# Patient Record
Sex: Male | Born: 1955 | Race: White | Hispanic: No | Smoking: Current every day smoker
Health system: Southern US, Community
[De-identification: ages and names within clinical notes are randomized; demographics above are authoritative.]

## PROBLEM LIST (undated history)

## (undated) DIAGNOSIS — K922 Gastrointestinal hemorrhage, unspecified: Secondary | ICD-10-CM

## (undated) DIAGNOSIS — Z720 Tobacco use: Secondary | ICD-10-CM

## (undated) DIAGNOSIS — I10 Essential (primary) hypertension: Secondary | ICD-10-CM

## (undated) DIAGNOSIS — I2510 Atherosclerotic heart disease of native coronary artery without angina pectoris: Secondary | ICD-10-CM

## (undated) DIAGNOSIS — I213 ST elevation (STEMI) myocardial infarction of unspecified site: Secondary | ICD-10-CM

## (undated) DIAGNOSIS — K269 Duodenal ulcer, unspecified as acute or chronic, without hemorrhage or perforation: Secondary | ICD-10-CM

## (undated) DIAGNOSIS — I469 Cardiac arrest, cause unspecified: Secondary | ICD-10-CM

## (undated) DIAGNOSIS — C3490 Malignant neoplasm of unspecified part of unspecified bronchus or lung: Secondary | ICD-10-CM

## (undated) DIAGNOSIS — F1010 Alcohol abuse, uncomplicated: Secondary | ICD-10-CM

## (undated) DIAGNOSIS — J449 Chronic obstructive pulmonary disease, unspecified: Secondary | ICD-10-CM

## (undated) DIAGNOSIS — B1920 Unspecified viral hepatitis C without hepatic coma: Secondary | ICD-10-CM

## (undated) HISTORY — PX: LUNG LOBECTOMY: SHX167

## (undated) HISTORY — PX: TONSILLECTOMY: SUR1361

---

## 2014-01-09 DIAGNOSIS — I213 ST elevation (STEMI) myocardial infarction of unspecified site: Secondary | ICD-10-CM

## 2014-01-09 DIAGNOSIS — I469 Cardiac arrest, cause unspecified: Secondary | ICD-10-CM

## 2014-01-09 HISTORY — DX: ST elevation (STEMI) myocardial infarction of unspecified site: I21.3

## 2014-01-09 HISTORY — DX: Cardiac arrest, cause unspecified: I46.9

## 2014-01-31 ENCOUNTER — Inpatient Hospital Stay
Admission: AD | Admit: 2014-01-31 | Discharge: 2014-03-11 | Disposition: E | Payer: Self-pay | Source: Ambulatory Visit | Attending: Internal Medicine | Admitting: Internal Medicine

## 2014-01-31 ENCOUNTER — Other Ambulatory Visit (HOSPITAL_COMMUNITY): Payer: Self-pay

## 2014-01-31 ENCOUNTER — Encounter: Payer: Self-pay | Admitting: Pulmonary Disease

## 2014-01-31 DIAGNOSIS — J9621 Acute and chronic respiratory failure with hypoxia: Secondary | ICD-10-CM

## 2014-01-31 DIAGNOSIS — R0902 Hypoxemia: Secondary | ICD-10-CM

## 2014-01-31 DIAGNOSIS — J9620 Acute and chronic respiratory failure, unspecified whether with hypoxia or hypercapnia: Secondary | ICD-10-CM

## 2014-01-31 DIAGNOSIS — Z930 Tracheostomy status: Secondary | ICD-10-CM

## 2014-01-31 HISTORY — DX: Cardiac arrest, cause unspecified: I46.9

## 2014-01-31 HISTORY — DX: ST elevation (STEMI) myocardial infarction of unspecified site: I21.3

## 2014-01-31 HISTORY — DX: Alcohol abuse, uncomplicated: F10.10

## 2014-01-31 HISTORY — DX: Gastrointestinal hemorrhage, unspecified: K92.2

## 2014-01-31 HISTORY — DX: Tobacco use: Z72.0

## 2014-01-31 HISTORY — DX: Duodenal ulcer, unspecified as acute or chronic, without hemorrhage or perforation: K26.9

## 2014-01-31 HISTORY — DX: Atherosclerotic heart disease of native coronary artery without angina pectoris: I25.10

## 2014-01-31 HISTORY — DX: Essential (primary) hypertension: I10

## 2014-01-31 HISTORY — DX: Unspecified viral hepatitis C without hepatic coma: B19.20

## 2014-01-31 HISTORY — DX: Chronic obstructive pulmonary disease, unspecified: J44.9

## 2014-01-31 HISTORY — DX: Malignant neoplasm of unspecified part of unspecified bronchus or lung: C34.90

## 2014-01-31 LAB — CBC WITH DIFFERENTIAL/PLATELET
BASOS ABS: 0 10*3/uL (ref 0.0–0.1)
BASOS PCT: 0 % (ref 0–1)
Eosinophils Absolute: 0.2 10*3/uL (ref 0.0–0.7)
Eosinophils Relative: 2 % (ref 0–5)
HCT: 28.6 % — ABNORMAL LOW (ref 39.0–52.0)
Hemoglobin: 8.7 g/dL — ABNORMAL LOW (ref 13.0–17.0)
Lymphocytes Relative: 6 % — ABNORMAL LOW (ref 12–46)
Lymphs Abs: 0.5 10*3/uL — ABNORMAL LOW (ref 0.7–4.0)
MCH: 26.6 pg (ref 26.0–34.0)
MCHC: 30.4 g/dL (ref 30.0–36.0)
MCV: 87.5 fL (ref 78.0–100.0)
Monocytes Absolute: 0.7 10*3/uL (ref 0.1–1.0)
Monocytes Relative: 7 % (ref 3–12)
NEUTROS ABS: 8.1 10*3/uL — AB (ref 1.7–7.7)
NEUTROS PCT: 85 % — AB (ref 43–77)
Platelets: 224 10*3/uL (ref 150–400)
RBC: 3.27 MIL/uL — ABNORMAL LOW (ref 4.22–5.81)
RDW: 18 % — ABNORMAL HIGH (ref 11.5–15.5)
WBC: 9.6 10*3/uL (ref 4.0–10.5)

## 2014-01-31 LAB — MAGNESIUM: MAGNESIUM: 1.8 mg/dL (ref 1.5–2.5)

## 2014-01-31 LAB — LACTIC ACID, PLASMA: LACTIC ACID, VENOUS: 0.8 mmol/L (ref 0.5–2.2)

## 2014-01-31 LAB — PHOSPHORUS: PHOSPHORUS: 1.9 mg/dL — AB (ref 2.3–4.6)

## 2014-01-31 LAB — BLOOD GAS, ARTERIAL
ACID-BASE EXCESS: 11.1 mmol/L — AB (ref 0.0–2.0)
Bicarbonate: 35.9 mEq/L — ABNORMAL HIGH (ref 20.0–24.0)
FIO2: 0.5 %
MECHVT: 500 mL
O2 SAT: 99.2 %
PCO2 ART: 55.3 mmHg — AB (ref 35.0–45.0)
PEEP: 5 cmH2O
Patient temperature: 98.6
RATE: 16 resp/min
TCO2: 37.6 mmol/L (ref 0–100)
pH, Arterial: 7.428 (ref 7.350–7.450)
pO2, Arterial: 173 mmHg — ABNORMAL HIGH (ref 80.0–100.0)

## 2014-01-31 LAB — COMPREHENSIVE METABOLIC PANEL
ALK PHOS: 62 U/L (ref 39–117)
ALT: 54 U/L — ABNORMAL HIGH (ref 0–53)
ANION GAP: 8 (ref 5–15)
AST: 99 U/L — ABNORMAL HIGH (ref 0–37)
Albumin: 2.3 g/dL — ABNORMAL LOW (ref 3.5–5.2)
BUN: 21 mg/dL (ref 6–23)
CALCIUM: 7.7 mg/dL — AB (ref 8.4–10.5)
CO2: 36 meq/L — AB (ref 19–32)
Chloride: 101 mEq/L (ref 96–112)
Creatinine, Ser: 0.82 mg/dL (ref 0.50–1.35)
GFR calc non Af Amer: >90 mL/min (ref >90)
GLUCOSE: 112 mg/dL — AB (ref 70–99)
POTASSIUM: 3.5 meq/L — AB (ref 3.7–5.3)
SODIUM: 145 meq/L (ref 137–147)
TOTAL PROTEIN: 5.9 g/dL — AB (ref 6.0–8.3)
Total Bilirubin: 1 mg/dL (ref 0.3–1.2)

## 2014-01-31 LAB — HEMOGLOBIN A1C
Hgb A1c MFr Bld: 5.9 % — ABNORMAL HIGH (ref <5.7)
Mean Plasma Glucose: 123 mg/dL — ABNORMAL HIGH (ref <117)

## 2014-01-31 LAB — PROCALCITONIN: Procalcitonin: <0.1 ng/mL

## 2014-01-31 LAB — TSH: TSH: 5.86 u[IU]/mL — AB (ref 0.350–4.500)

## 2014-01-31 NOTE — Consult Note (Signed)
Name: Bradley Doyle MRN: [id number] DOB: 1955-11-06    ADMISSION DATE:  01/10/2014 CONSULTATION DATE:  01/26/2014  REFERRING MD :  Dr. Laren Everts PRIMARY SERVICE:  [facility]   CHIEF COMPLAINT:  Respiratory Failure, Tracheostomy status, Ventilator Dependence   BRIEF PATIENT DESCRIPTION: 58 y/o M admitted to [facility] from [facility] after prolonged hospitalization related to STEMI + Cardiac Arrest after a tonsillectomy (L) due to invasive tonsillar cancer.    SIGNIFICANT EVENTS / STUDIES:  9/23  Admit to [facility] from [facility] post STEMI & cardiac arrest after a tonsillectomy for invasive tonsillar CA  LINES / TUBES: # 8 Trach >>   CULTURES: Cultures to date from [facility] >> negative (blood, urine, sputum)  ANTIBIOTICS: Per [facility] MD  HISTORY OF PRESENT ILLNESS:  58 y/o M with PMH of Hepatitis C+, ETOH / Tobacco Abuse, Duodenal Ulcer, HTN, lung cancer (? Type) s/p lobectomy on left, and invasive tonsillar squamous cell carcinoma who was admitted on 01/19/14 to [facility] for a planned left tonsillectomy.  In the PACU, the patient unfortunately suffered a cardiac arrest with and ST elevation MI.  He underwent a cardiac catheterization and was found to have a lesion in the LAD, circumflex and RCA.  He required PCTA to the LAD and was transferred to ICU for hypothermia protocol.  His ICU course was prolonged due to cardiogenic shock necessitating vasopressor support, respiratory failure with subsequent tracheostomy, IABP placement, anemia s/p transfusion of PRBC's, acute kidney injury and encephalopathy.  After the cath lab visit, he began to wake and induced hypothermia was discontinued.  The patient had a tracheostomy placed on 9/18 and was unable to wean from mechanical ventilation.  He was transferred to [facility] in [place] for further rehab efforts / weaning.     PAST MEDICAL HISTORY :  Past Medical History  Diagnosis Date  . Hepatitis C   . COPD (chronic obstructive pulmonary  disease)   . ETOH abuse   . Tobacco abuse   . Lung cancer     s/p L partial lobectomy   . Duodenal ulcer   . HTN (hypertension)   . STEMI (ST elevation myocardial infarction) 01/2014    At [place] tonsillectomy for tonsillar CA  . Cardiac arrest 01/2014    post hypothermia protocol    Past Surgical History  Procedure Laterality Date  . Tonsillectomy    . Lung lobectomy Left     lung ca   Prior to Admission medications   Not on File   No Known Allergies  FAMILY HISTORY:  No family history on file.  SOCIAL HISTORY:  reports that he has been smoking.  He does not have any smokeless tobacco history on file. He reports that he drinks alcohol. His drug history is not on file.  REVIEW OF SYSTEMS:   Unable to complete as patient is on mechanical ventilation.   SUBJECTIVE:   VITAL SIGNS:  Reviewed at bedside, VSS.    PHYSICAL EXAMINATION: General:  Chronically ill in NAD Neuro:  Awake, alert, nods appropriately, MAE, generalized weakness  HEENT:  Mm pink/moist, no JVD, #8 trach midline c/d/i Cardiovascular:  s1s2 rrr, no m/r/g Lungs:  resp's even/non-labored, lungs bilaterally with scattered wheezing  Abdomen:  Round/soft, bsx4 active, PEG intact  Musculoskeletal:  No acute deformities  Skin:  Warm/dry, no edema   No results found for this basename: NA, K, CL, CO2, BUN, CREATININE, GLUCOSE,  in the last 168 hours  Recent Labs Lab 01/26/2014 1349  HGB 8.7*  HCT 28.6*  WBC 9.6  PLT 224   Dg Chest Port 1 View  02/04/2014   CLINICAL DATA:  Abnormal breath sounds. Respiratory insufficiency requiring tracheostomy at an outside institution.  EXAM: PORTABLE CHEST - 1 VIEW  COMPARISON:  None.  FINDINGS: Normal cardiomediastinal silhouette. Surgical clips LEFT AP window. Mild vascular congestion. LEFT base atelectasis, infiltrate, and effusion. Tracheostomy in apparent good position. PICC line tip proximal SVC.  IMPRESSION: Tracheostomy in apparent good position.  LEFT base process of uncertain significance. Mild vascular congestion.   Electronically Signed   By: Rolla Flatten M.D.   On: 01/20/2014 15:06   Dg Abd Portable 1v  01/30/2014   CLINICAL DATA:  PEG tube confirmation.  EXAM: PORTABLE ABDOMEN - 1 VIEW  COMPARISON:  None.  FINDINGS: Gastrostomy tube projects in the left central upper abdomen consistent with its position within the stomach. This could be more confidently confirmed with injection of oral contrast through the gastrostomy tube followed by single supine abdominal radiograph.  Normal bowel gas pattern. There are surgical sutures in the right lower quadrant. Right hip prosthesis appears well aligned.  IMPRESSION: Gastrostomy tube projects within the stomach in the central upper abdomen. No acute findings.   Electronically Signed   By: Lajean Manes M.D.   On: 01/12/2014 14:43    ASSESSMENT / PLAN:  58 y/o M admitted to [facility] after admission to [facility] for tonsillectomy in the setting of invasive squamous cell carcinoma.  Hx of lung cancer s/p lobectomy (unknown type).  Course complicated by NSTEMI & cardiac arrest in PACU.  S/P PCTA to LAD.  Trach / PEG on 9/18.  Tx to [facility] on 9/23.     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 Timing & type)  Plan: Full support, begin wean protocol.  Appears he will progress easily.  Assess ABG now Wean PEEP/FiO2 for sats > 92% F/u CXR in am  Duonebs Q6 with PRN albuterol Q3 Mobilize as able Will need to follow up with ENT / ONC regarding tonsillar CA Would not decannulate even if liberated from mechanical ventilation until input received from ENT  NSTEMI CAD s/p Stent to LAD HTN  Plan: Per primary  Hx GIB   Plan: Monitor for bleeding   Noe Gens, NP-C [facility] Pgr: [phone] or [phone]   01/16/2014,  3:37 PM  Patient seen and examined, agree with above note.  I dictated the care and orders written for this patient under my direction.  Rush Farmer, MD [phone]

## 2014-02-01 ENCOUNTER — Other Ambulatory Visit (HOSPITAL_COMMUNITY): Payer: Self-pay

## 2014-02-01 DIAGNOSIS — Z930 Tracheostomy status: Secondary | ICD-10-CM

## 2014-02-01 DIAGNOSIS — J9620 Acute and chronic respiratory failure, unspecified whether with hypoxia or hypercapnia: Secondary | ICD-10-CM

## 2014-02-01 DIAGNOSIS — R0902 Hypoxemia: Secondary | ICD-10-CM

## 2014-02-01 LAB — VITAMIN D 25 HYDROXY (VIT D DEFICIENCY, FRACTURES): Vit D, 25-Hydroxy: 13 ng/mL — ABNORMAL LOW (ref 30–89)

## 2014-02-02 ENCOUNTER — Other Ambulatory Visit (HOSPITAL_COMMUNITY): Payer: Self-pay

## 2014-02-02 LAB — COMPREHENSIVE METABOLIC PANEL
ALT: 38 U/L (ref 0–53)
AST: 59 U/L — AB (ref 0–37)
Albumin: 2.1 g/dL — ABNORMAL LOW (ref 3.5–5.2)
Alkaline Phosphatase: 71 U/L (ref 39–117)
Anion gap: 12 (ref 5–15)
BILIRUBIN TOTAL: 1.3 mg/dL — AB (ref 0.3–1.2)
BUN: 15 mg/dL (ref 6–23)
CHLORIDE: 101 meq/L (ref 96–112)
CO2: 34 meq/L — AB (ref 19–32)
CREATININE: 0.78 mg/dL (ref 0.50–1.35)
Calcium: 7.6 mg/dL — ABNORMAL LOW (ref 8.4–10.5)
GFR calc Af Amer: >90 mL/min (ref >90)
Glucose, Bld: 109 mg/dL — ABNORMAL HIGH (ref 70–99)
Potassium: 3 mEq/L — ABNORMAL LOW (ref 3.7–5.3)
Sodium: 147 mEq/L (ref 137–147)
Total Protein: 5.9 g/dL — ABNORMAL LOW (ref 6.0–8.3)

## 2014-02-02 LAB — BASIC METABOLIC PANEL
ANION GAP: 11 (ref 5–15)
Anion gap: 9 (ref 5–15)
BUN: 16 mg/dL (ref 6–23)
BUN: 14 mg/dL (ref 6–23)
CHLORIDE: 99 meq/L (ref 96–112)
CHLORIDE: 99 meq/L (ref 96–112)
CO2: 35 meq/L — AB (ref 19–32)
CO2: 33 meq/L — AB (ref 19–32)
Calcium: 7.7 mg/dL — ABNORMAL LOW (ref 8.4–10.5)
Calcium: 7.6 mg/dL — ABNORMAL LOW (ref 8.4–10.5)
Creatinine, Ser: 0.77 mg/dL (ref 0.50–1.35)
Creatinine, Ser: 0.72 mg/dL (ref 0.50–1.35)
GFR calc Af Amer: >90 mL/min (ref >90)
GFR calc Af Amer: >90 mL/min (ref >90)
GFR calc non Af Amer: >90 mL/min (ref >90)
GFR calc non Af Amer: >90 mL/min (ref >90)
GLUCOSE: 118 mg/dL — AB (ref 70–99)
GLUCOSE: 293 mg/dL — AB (ref 70–99)
POTASSIUM: 3 meq/L — AB (ref 3.7–5.3)
Potassium: 2.7 mEq/L — CL (ref 3.7–5.3)
Sodium: 145 mEq/L (ref 137–147)
Sodium: 141 mEq/L (ref 137–147)

## 2014-02-02 LAB — URINALYSIS, ROUTINE W REFLEX MICROSCOPIC
Bilirubin Urine: NEGATIVE
Glucose, UA: NEGATIVE mg/dL
Ketones, ur: NEGATIVE mg/dL
LEUKOCYTES UA: NEGATIVE
Nitrite: NEGATIVE
Protein, ur: NEGATIVE mg/dL
Specific Gravity, Urine: 1.01 (ref 1.005–1.030)
UROBILINOGEN UA: 4 mg/dL — AB (ref 0.0–1.0)
pH: 7.5 (ref 5.0–8.0)

## 2014-02-02 LAB — CBC WITH DIFFERENTIAL/PLATELET
BASOS ABS: 0 10*3/uL (ref 0.0–0.1)
Basophils Relative: 0 % (ref 0–1)
EOS ABS: 0.1 10*3/uL (ref 0.0–0.7)
Eosinophils Relative: 2 % (ref 0–5)
HCT: 26.4 % — ABNORMAL LOW (ref 39.0–52.0)
Hemoglobin: 8.1 g/dL — ABNORMAL LOW (ref 13.0–17.0)
LYMPHS PCT: 12 % (ref 12–46)
Lymphs Abs: 0.7 10*3/uL (ref 0.7–4.0)
MCH: 26.7 pg (ref 26.0–34.0)
MCHC: 30.7 g/dL (ref 30.0–36.0)
MCV: 87.1 fL (ref 78.0–100.0)
Monocytes Absolute: 0.6 10*3/uL (ref 0.1–1.0)
Monocytes Relative: 9 % (ref 3–12)
NEUTROS ABS: 4.8 10*3/uL (ref 1.7–7.7)
Neutrophils Relative %: 77 % (ref 43–77)
Platelets: 226 10*3/uL (ref 150–400)
RBC: 3.03 MIL/uL — ABNORMAL LOW (ref 4.22–5.81)
RDW: 18.2 % — AB (ref 11.5–15.5)
WBC: 6.2 10*3/uL (ref 4.0–10.5)

## 2014-02-02 LAB — PHOSPHORUS
Phosphorus: 2.9 mg/dL (ref 2.3–4.6)
Phosphorus: 2.9 mg/dL (ref 2.3–4.6)

## 2014-02-02 LAB — CBC
HEMATOCRIT: 25.5 % — AB (ref 39.0–52.0)
HEMOGLOBIN: 7.8 g/dL — AB (ref 13.0–17.0)
MCH: 26.2 pg (ref 26.0–34.0)
MCHC: 30.6 g/dL (ref 30.0–36.0)
MCV: 85.6 fL (ref 78.0–100.0)
Platelets: 215 10*3/uL (ref 150–400)
RBC: 2.98 MIL/uL — ABNORMAL LOW (ref 4.22–5.81)
RDW: 18.3 % — ABNORMAL HIGH (ref 11.5–15.5)
WBC: 5.7 10*3/uL (ref 4.0–10.5)

## 2014-02-02 LAB — BLOOD GAS, ARTERIAL
Acid-Base Excess: 9.5 mmol/L — ABNORMAL HIGH (ref 0.0–2.0)
Bicarbonate: 33.3 mEq/L — ABNORMAL HIGH (ref 20.0–24.0)
FIO2: 0.28 %
MECHVT: 500 mL
O2 Saturation: 96.3 %
PEEP: 5 cmH2O
PH ART: 7.5 — AB (ref 7.350–7.450)
PO2 ART: 78.1 mmHg — AB (ref 80.0–100.0)
Patient temperature: 98.6
RATE: 16 resp/min
TCO2: 34.6 mmol/L (ref 0–100)
pCO2 arterial: 43.1 mmHg (ref 35.0–45.0)

## 2014-02-02 LAB — PREPARE RBC (CROSSMATCH)

## 2014-02-02 LAB — MAGNESIUM: Magnesium: 1.9 mg/dL (ref 1.5–2.5)

## 2014-02-02 LAB — URINE MICROSCOPIC-ADD ON

## 2014-02-02 LAB — ABO/RH: ABO/RH(D): A NEG

## 2014-02-02 NOTE — Progress Notes (Signed)
Name: Bradley Doyle MRN: [id number] DOB: 10/09/1955    ADMISSION DATE:  02/01/2014 CONSULTATION DATE:  01/17/2014  REFERRING MD :  Dr. Laren Everts PRIMARY SERVICE:  [facility]   CHIEF COMPLAINT:  Respiratory Failure, Tracheostomy status, Ventilator Dependence   BRIEF PATIENT DESCRIPTION: 58 y/o M admitted to [facility] from [facility] after prolonged hospitalization related to STEMI + Cardiac Arrest after a tonsillectomy (L) due to invasive tonsillar cancer.    SIGNIFICANT EVENTS / STUDIES:  9/23  Admit to [facility] from [facility] post STEMI & cardiac arrest after a tonsillectomy for invasive tonsillar CA  LINES / TUBES: # 8 Trach >>   CULTURES: Cultures to date from [facility] >> negative (blood, urine, sputum)  ANTIBIOTICS: Per [facility] MD  SUBJECTIVE: Tolerating PS trials well today, 15/5 for 2 hours today.  VITAL SIGNS:  Reviewed at bedside, VSS.   PHYSICAL EXAMINATION: General:  Chronically ill in NAD Neuro:  Awake, alert, nods appropriately, MAE, generalized weakness  HEENT:  Mm pink/moist, no JVD, #8 trach midline c/d/i Cardiovascular:  s1s2 rrr, no m/r/g Lungs:  resp's even/non-labored, lungs bilaterally with scattered wheezing  Abdomen:  Round/soft, bsx4 active, PEG intact  Musculoskeletal:  No acute deformities  Skin:  Warm/dry, no edema   Recent Labs Lab 02/02/2014 1349 02/02/14 1040  NA 145 145  K 3.5* 2.7*  CL 101 99  CO2 36* 35*  BUN 21 16  CREATININE 0.82 0.77  GLUCOSE 112* 118*    Recent Labs Lab 01/26/2014 1349 02/02/14 1040  HGB 8.7* 7.8*  HCT 28.6* 25.5*  WBC 9.6 5.7  PLT 224 215   Dg Chest Port 1 View  02/02/2014   CLINICAL DATA:  Respiratory failure.  EXAM: PORTABLE CHEST - 1 VIEW  COMPARISON:  02/01/2014  FINDINGS: Cardiac silhouette is normal in size and configuration. No mediastinal or hilar masses.  Left lung base irregular interstitial and mild airspace opacities are stable from the most recent prior study, but improved when compared to  02/05/2014. There is a small associated pleural effusion.  Lungs are hyperexpanded. No areas of new lung opacity. No right effusion. No pneumothorax.  Cardiac silhouette is normal in size. Left hilar clips and mild volume loss on the left reflect changes from previous left lung surgery.  Right PICC and tracheostomy tube are stable and well positioned.  IMPRESSION: 1. Since the study dated 01/27/2014, left lung base opacity has improved. This may reflect improved atelectasis or improved pneumonia. 2. No new abnormalities. Changes from previous left lung surgery and COPD are stable. 3. Stable support apparatus.   Electronically Signed   By: Lajean Manes M.D.   On: 02/02/2014 07:54   Dg Chest Port 1 View  02/01/2014   CLINICAL DATA:  Airspace disease  EXAM: PORTABLE CHEST - 1 VIEW  COMPARISON:  January 31, 2014  FINDINGS: Tracheostomy tube tip is 8.6 cm above the carina. Central catheter tip is in the superior vena cava. No pneumothorax. Patchy airspace consolidation in the left base remains. Lungs are otherwise clear. There is underlying emphysematous change. The heart size is normal. Pulmonary vascularity reflects underlying emphysema. There is postoperative change in the left perihilar region. No bone lesions. No adenopathy.  IMPRESSION: Tube and catheter positions as described without pneumothorax. Persistent patchy airspace consolidation left base. Underlying emphysema. No new opacity. No change in cardiac silhouette.   Electronically Signed   By: Lowella Grip M.D.   On: 02/01/2014 07:37   Dg Chest Port 1 View  01/26/2014  CLINICAL DATA:  Abnormal breath sounds. Respiratory insufficiency requiring tracheostomy at an outside institution.  EXAM: PORTABLE CHEST - 1 VIEW  COMPARISON:  None.  FINDINGS: Normal cardiomediastinal silhouette. Surgical clips LEFT AP window. Mild vascular congestion. LEFT base atelectasis, infiltrate, and effusion. Tracheostomy in apparent good position. PICC line tip proximal  SVC.  IMPRESSION: Tracheostomy in apparent good position. LEFT base process of uncertain significance. Mild vascular congestion.   Electronically Signed   By: Rolla Flatten M.D.   On: 01/27/2014 15:06   Dg Abd Portable 1v  02/02/2014   CLINICAL DATA:  Ileus.  EXAM: PORTABLE ABDOMEN - 1 VIEW  COMPARISON:  Single view of the abdomen 02/01/2014 and 01/16/2014.  FINDINGS: Gaseous distention of small bowel with loops measuring up to 5.5 cm is identified. The number of distended loops appears decreased. There may be a small amount of gas in the cecum. Feeding tube is noted. Left basilar airspace disease is identified.  IMPRESSION: Persistent but mildly improved gaseous distention of small bowel. The appearance could be due to ileus but is worrisome for small bowel obstruction.   Electronically Signed   By: Inge Rise M.D.   On: 02/02/2014 08:11   Dg Abd Portable 1v  02/01/2014   CLINICAL DATA:  Abdominal distention  EXAM: PORTABLE ABDOMEN - 1 VIEW  COMPARISON:  January 31, 2014  FINDINGS: Gastrostomy catheter is position in the region of the stomach. There is new generalized small bowel dilatation in a pattern raising concern for either ileus or obstruction. No free air. Status post total hip replacement on the right.  IMPRESSION: Bowel gas pattern suggests either ileus or a degree of obstruction. This appearance represents a change compared to 1 day prior. No free air seen. Gastrostomy in region of stomach.  These results will be called to the ordering clinician or representative by the Radiologist Assistant, and communication documented in the PACS or zVision Dashboard.   Electronically Signed   By: Lowella Grip M.D.   On: 02/01/2014 10:09   Dg Abd Portable 1v  01/21/2014   CLINICAL DATA:  PEG tube confirmation.  EXAM: PORTABLE ABDOMEN - 1 VIEW  COMPARISON:  None.  FINDINGS: Gastrostomy tube projects in the left central upper abdomen consistent with its position within the stomach. This could be  more confidently confirmed with injection of oral contrast through the gastrostomy tube followed by single supine abdominal radiograph.  Normal bowel gas pattern. There are surgical sutures in the right lower quadrant. Right hip prosthesis appears well aligned.  IMPRESSION: Gastrostomy tube projects within the stomach in the central upper abdomen. No acute findings.   Electronically Signed   By: Lajean Manes M.D.   On: 02/01/2014 14:43    ASSESSMENT / PLAN:  58 y/o M admitted to [facility] after admission to [facility] for tonsillectomy in the setting of invasive squamous cell carcinoma.  Hx of lung cancer s/p lobectomy (unknown type).  Course complicated by NSTEMI & cardiac arrest in PACU.  S/P PCTA to LAD.  Trach / PEG on 9/18.  Tx to [facility] on 9/23.     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 Timing & type)  Plan: Continue with weaning, dropped to 15/5 with target of 2 hours today Weaned PEEP/FiO2 for sats > 92% F/u CXR in am  Duonebs Q6 with PRN albuterol Q3 Mobilize as able Will need to follow  up with ENT / ONC regarding tonsillar CA Would not decannulate even if liberated from mechanical ventilation until input received from ENT  NSTEMI CAD s/p Stent to LAD HTN  Plan: Per primary  Hx GIB   Plan: Monitor for bleeding  Rush Farmer, M.D. [facility]. Pager: [phone]. After hours pager: [phone].

## 2014-02-03 ENCOUNTER — Other Ambulatory Visit (HOSPITAL_COMMUNITY): Payer: Self-pay

## 2014-02-03 LAB — CBC WITH DIFFERENTIAL/PLATELET
BASOS PCT: 0 % (ref 0–1)
Basophils Absolute: 0 10*3/uL (ref 0.0–0.1)
Eosinophils Absolute: 0 10*3/uL (ref 0.0–0.7)
Eosinophils Relative: 0 % (ref 0–5)
HEMATOCRIT: 32.6 % — AB (ref 39.0–52.0)
Hemoglobin: 10.5 g/dL — ABNORMAL LOW (ref 13.0–17.0)
LYMPHS PCT: 8 % — AB (ref 12–46)
Lymphs Abs: 0.5 10*3/uL — ABNORMAL LOW (ref 0.7–4.0)
MCH: 27.3 pg (ref 26.0–34.0)
MCHC: 32.2 g/dL (ref 30.0–36.0)
MCV: 84.7 fL (ref 78.0–100.0)
MONO ABS: 0.3 10*3/uL (ref 0.1–1.0)
MONOS PCT: 5 % (ref 3–12)
Neutro Abs: 5.7 10*3/uL (ref 1.7–7.7)
Neutrophils Relative %: 87 % — ABNORMAL HIGH (ref 43–77)
Platelets: 231 10*3/uL (ref 150–400)
RBC: 3.85 MIL/uL — ABNORMAL LOW (ref 4.22–5.81)
RDW: 16.6 % — AB (ref 11.5–15.5)
WBC: 6.6 10*3/uL (ref 4.0–10.5)

## 2014-02-03 LAB — COMPREHENSIVE METABOLIC PANEL
ALT: 41 U/L (ref 0–53)
ANION GAP: 11 (ref 5–15)
AST: 90 U/L — ABNORMAL HIGH (ref 0–37)
Albumin: 1.8 g/dL — ABNORMAL LOW (ref 3.5–5.2)
Alkaline Phosphatase: 65 U/L (ref 39–117)
BILIRUBIN TOTAL: 1.5 mg/dL — AB (ref 0.3–1.2)
BUN: 16 mg/dL (ref 6–23)
CO2: 29 meq/L (ref 19–32)
Calcium: 7.5 mg/dL — ABNORMAL LOW (ref 8.4–10.5)
Chloride: 97 mEq/L (ref 96–112)
Creatinine, Ser: 0.62 mg/dL (ref 0.50–1.35)
GFR calc non Af Amer: >90 mL/min (ref >90)
GLUCOSE: 202 mg/dL — AB (ref 70–99)
POTASSIUM: 5.1 meq/L (ref 3.7–5.3)
Sodium: 137 mEq/L (ref 137–147)
Total Protein: 6.2 g/dL (ref 6.0–8.3)

## 2014-02-03 LAB — PHOSPHORUS: Phosphorus: 3.8 mg/dL (ref 2.3–4.6)

## 2014-02-03 LAB — URINE CULTURE
Colony Count: NO GROWTH
Culture: NO GROWTH

## 2014-02-03 LAB — MAGNESIUM: Magnesium: 1.9 mg/dL (ref 1.5–2.5)

## 2014-02-04 ENCOUNTER — Other Ambulatory Visit (HOSPITAL_COMMUNITY): Payer: Self-pay

## 2014-02-04 LAB — COMPREHENSIVE METABOLIC PANEL
ALT: 36 U/L (ref 0–53)
ANION GAP: 10 (ref 5–15)
AST: 45 U/L — ABNORMAL HIGH (ref 0–37)
Albumin: 2.1 g/dL — ABNORMAL LOW (ref 3.5–5.2)
Alkaline Phosphatase: 81 U/L (ref 39–117)
BILIRUBIN TOTAL: 1 mg/dL (ref 0.3–1.2)
BUN: 20 mg/dL (ref 6–23)
CALCIUM: 8.2 mg/dL — AB (ref 8.4–10.5)
CHLORIDE: 96 meq/L (ref 96–112)
CO2: 35 meq/L — AB (ref 19–32)
CREATININE: 0.72 mg/dL (ref 0.50–1.35)
GFR calc Af Amer: >90 mL/min (ref >90)
Glucose, Bld: 105 mg/dL — ABNORMAL HIGH (ref 70–99)
Potassium: 3.1 mEq/L — ABNORMAL LOW (ref 3.7–5.3)
Sodium: 141 mEq/L (ref 137–147)
Total Protein: 6.1 g/dL (ref 6.0–8.3)

## 2014-02-04 LAB — WOUND CULTURE: Special Requests: NORMAL

## 2014-02-04 LAB — CBC WITH DIFFERENTIAL/PLATELET
BASOS ABS: 0 10*3/uL (ref 0.0–0.1)
BASOS PCT: 0 % (ref 0–1)
Eosinophils Absolute: 0 10*3/uL (ref 0.0–0.7)
Eosinophils Relative: 0 % (ref 0–5)
HCT: 31.2 % — ABNORMAL LOW (ref 39.0–52.0)
Hemoglobin: 10.2 g/dL — ABNORMAL LOW (ref 13.0–17.0)
LYMPHS PCT: 8 % — AB (ref 12–46)
Lymphs Abs: 0.7 10*3/uL (ref 0.7–4.0)
MCH: 27.8 pg (ref 26.0–34.0)
MCHC: 32.7 g/dL (ref 30.0–36.0)
MCV: 85 fL (ref 78.0–100.0)
Monocytes Absolute: 0.7 10*3/uL (ref 0.1–1.0)
Monocytes Relative: 8 % (ref 3–12)
NEUTROS ABS: 7.3 10*3/uL (ref 1.7–7.7)
NEUTROS PCT: 84 % — AB (ref 43–77)
PLATELETS: 256 10*3/uL (ref 150–400)
RBC: 3.67 MIL/uL — ABNORMAL LOW (ref 4.22–5.81)
RDW: 16.5 % — AB (ref 11.5–15.5)
WBC: 8.7 10*3/uL (ref 4.0–10.5)

## 2014-02-04 LAB — TYPE AND SCREEN
ABO/RH(D): A NEG
Antibody Screen: NEGATIVE
Unit division: 0
Unit division: 0

## 2014-02-04 LAB — MAGNESIUM: MAGNESIUM: 1.8 mg/dL (ref 1.5–2.5)

## 2014-02-04 LAB — PHOSPHORUS: PHOSPHORUS: 3.5 mg/dL (ref 2.3–4.6)

## 2014-02-04 NOTE — Progress Notes (Addendum)
Subjective: Pt unable to participate in ROS 2/2 acute encephalopathy and VDRF.  Objective: Vital signs in last 24 hours:   Tmax:  98.4F BP:  150/84-160/90 HR:  54-69 RR:  16-25 Pulse Ox: 93-99% on Vent I/O:  1560/2800 (-bal)  Physical Exam: General: Ill appearing,in NAD HEENT: PERRLA EOMI bilaterally. Sclera anicteric bilaterally. MMM NECK: Supple, No JVD or LAD, + Trach midline and secure. #8 shiley Lungs: Symmetrical chest rise. Scatter wheezes and rhonchi CV: Normal S1, S2. No m/c/r Abdomen: Protuberant, however S/NT/ND +BS X 4. No rebound, guarding. +PEG secure and C/D/I EXT: No cyanosis or clubbing, 2+ edema in extremities U>L and scrotum Skin: Warm/dry and intact Neuro: grossly intact and SMAEs, unable to perform formal CN exam 2/2 acute encephalopathy   Lab Results  Recent Labs  02/03/14 1250 02/03/14 1445 02/04/14 1030  WBC  --  6.6 8.7  HGB  --  10.5* 10.2*  HCT  --  32.6* 31.2*  NA 137  --  141  K 5.1  --  3.1*  CL 97  --  96  CO2 29  --  35*  BUN 16  --  20  CREATININE 0.62  --  0.72   Liver Panel  Recent Labs  02/03/14 1250 02/04/14 1030  PROT 6.2 6.1  ALBUMIN 1.8* 2.1*  AST 90* 45*  ALT 41 36  ALKPHOS 65 81  BILITOT 1.5* 1.0   Sedimentation Rate No results found for this basename: ESRSEDRATE,  in the last 72 hours C-Reactive Protein No results found for this basename: CRP,  in the last 72 hours  Microbiology: Recent Results (from the past 240 hour(s))  CULTURE, RESPIRATORY (NON-EXPECTORATED)     Status: None   Collection Time    02/02/14 11:49 AM      Result Value Ref Range Status   Specimen Description TRACHEAL ASPIRATE   Final   Special Requests NONE   Final   Gram Stain     Final   Value: FEW WBC PRESENT,BOTH PMN AND MONONUCLEAR     RARE SQUAMOUS EPITHELIAL CELLS PRESENT     RARE GRAM POSITIVE COCCI     IN PAIRS IN CLUSTERS     Performed at [facility]   Culture     Final   Value: MODERATE GRAM NEGATIVE RODS      Performed at [facility]   Report Status PENDING   Incomplete  URINE CULTURE     Status: None   Collection Time    02/02/14 11:50 AM      Result Value Ref Range Status   Specimen Description URINE, RANDOM   Final   Special Requests NONE   Final   Culture  Setup Time     Final   Value: 02/02/2014 13:10     Performed at [place]     Final   Value: NO GROWTH     Performed at [facility]   Culture     Final   Value: NO GROWTH     Performed at [facility]   Report Status 02/03/2014 FINAL   Final  WOUND CULTURE     Status: None   Collection Time    02/02/14  1:54 PM      Result Value Ref Range Status   Specimen Description WOUND TRACHEAL SITE   Final   Special Requests Normal   Final   Gram Stain     Final   Value:  RARE WBC PRESENT, PREDOMINANTLY MONONUCLEAR     NO SQUAMOUS EPITHELIAL CELLS SEEN     NO ORGANISMS SEEN     Performed at [facility]   Culture     Final   Value: MULTIPLE ORGANISMS PRESENT, NONE PREDOMINANT     Note: NO STAPHYLOCOCCUS AUREUS ISOLATED NO GROUP A STREP (S.PYOGENES) ISOLATED     Performed at [facility]   Report Status 02/04/2014 FINAL   Final  CULTURE, BLOOD (ROUTINE X 2)     Status: None   Collection Time    02/02/14  2:10 PM      Result Value Ref Range Status   Specimen Description BLOOD PICC LINE   Final   Special Requests BOTTLES DRAWN AEROBIC AND ANAEROBIC 10CC   Final   Culture  Setup Time     Final   Value: 02/02/2014 19:53     Performed at [facility]   Culture     Final   Value:        BLOOD CULTURE RECEIVED NO GROWTH TO DATE CULTURE WILL BE HELD FOR 5 DAYS BEFORE ISSUING A FINAL NEGATIVE REPORT     Performed at [facility]   Report Status PENDING   Incomplete    Studies/Results: Dg Abd Portable 1v  02/04/2014   CLINICAL DATA:  Small bowel obstruction  EXAM: PORTABLE ABDOMEN - 1 VIEW  COMPARISON:  Portable exam 0641 hr compared to 02/03/2014   FINDINGS: Gastrostomy tube noted in LEFT upper quadrant.  Air-filled loops of bowel in the mid abdomen suspect transverse colon.  Small bowel gas pattern is normal without definite small bowel dilatation or wall thickening.  Gas present to the rectum.  Surgical clips RIGHT lower quadrant.  Embolization coils in RIGHT upper quadrant.  Bones demineralized.  No definite urinary tract calcification.  IMPRESSION: Gastrostomy tube.  Mild gaseous distention of the transverse colon without definite small bowel dilatation.   Electronically Signed   By: Lavonia Dana M.D.   On: 02/04/2014 08:44   Dg Abd Portable 1v  02/03/2014   CLINICAL DATA:  Small bowel obstruction, gastrostomy  EXAM: PORTABLE ABDOMEN - 1 VIEW  COMPARISON:  02/02/2014  FINDINGS: Residual persist left base airspace disease, suspect resolving pneumonia or aspiration. Gastrostomy noted in the left upper quadrant. Mild gaseous distention of central small bowel measures 5.2 cm in diameter, slightly improving. Air and contrast noted in the transverse colon. Findings suggest resolving obstruction versus ileus.  IMPRESSION: Residual left base airspace disease compatible with pneumonia or aspiration  Slightly less small bowel distension with more air and contrast in the colon, compatible with resolving obstruction versus ileus.   Electronically Signed   By: Daryll Brod M.D.   On: 02/03/2014 09:44    Medications: I have reviewed the patient's current medications.  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/h/o Lung Cancer s/p Lobectomy: -Continue vent weaning protocol; wean PEEP/FIO2 for sats > 92%  -continue Performist and Duonebs with prn BD -follow up with ENT / ONC regarding tonsillar CA  Post d/c from LTAC, d/w sister Juliann Pulse) and mother that pt will not be receiving radiation tx while in LTAC; will have ENT consult once VDRF is resolved NOTE: pulm recommended  NOT to decannulate even if liberated from mechanical ventilation until input received from ENT   Acute Ileus/PEM/Dysphagia:: -resolving, continue bowel rest today and repeat KUB in am, likely can resume TF and PEG use -continue  TPN for now and hopeful begin titration to off tomorrow, RD to assist with TF and TPN needs  Toxic Encephalopathy/Chronic Alcohol Abuse: -alcohol abuse likely a contributor of toxic encephalopathy, continue supportive care per tx plan and thiamine and folate daily supplementation; prn ativan ordered. Pt currently on haldol 5mg  q12h and ativan 1mg  q8h, plan to change to librium taper when taking PO and stop haldol -contributor of hypophos/mag/kalemia.  Hypomagnesium: -mag sulfate 2gm IV x 1 and start daily mag oxide 400mg  BID, recheck level in am; goal 2.0 given CAD  Hypokalemia: -replete, goal 4.0, recheck post infusion in 4h and in am  Alcoholic Hepatitis/Hepatis C hx: -resolving, checking CMP in am given TPN and h/o alcoholism will add acute hepatitis panel as no previous lab noted in admission paperwork and family/pt is not able to provide details  Rhabdomyolysis: -noted and treated PTA, recheck CPK in am esp given statin use and non-specific pain  Anasarca: -lymphedema consult and mgmt tomorrow, cont lasix as tolerated, good UOP  CAD s/p PTCA/HLP: -continue cardiac supportive medications (BB, ASA, ACE-I, Diuretic) and titrate to goal and tolerance; benefits vs risk of continuing Brilinta daily as ordered per cardiology at OSH.   Anemia of chronic disease, microcytic/PUD: -H/H stable w/o s/s of GI bleed, start ferrous sulfate + vitamin C BID when taking PO again -check iron panel, B12,  HTN, uncontrolled: -titrate anti-hypertensive agents to goal and tolerance, hydralazine and lisinopril increased yesterday, observe for clinical effect  Chronic Pain Syndrome: (pain etiology unknown) -continue fentanyl patch 3mcg q 72h  MDD vs Mood Disorder: -can  titrate upward Effexor dose as needed, for now continue 75mg  BID  Hypothyroidism: -continue levothyroxine 22mcg daily and recommend recheck TSH in 4 weeks and adjust dose accordingly; may warrant further workup as this is a new diagnosis for pt  Vitamin D Deficiency: -cont supplementation and recheck level in 4 weeks and adjust dose accordingly  Generalized Weakness: -PT/OT eval and tx  GI proph/Gastritis/PUD: Protonix 40mg  IV BID/probiotic VTE proph:   Changed lovenox to heparin given high bleeding risk with anti-platelet medication   Critical Care Time: 35 minutes  Howis Y Toler 02/04/2014, 12:31 PM

## 2014-02-05 ENCOUNTER — Other Ambulatory Visit (HOSPITAL_COMMUNITY): Payer: Self-pay

## 2014-02-05 LAB — HEPATITIS PANEL, ACUTE
HCV Ab: REACTIVE — AB
Hep A IgM: NONREACTIVE
Hep B C IgM: NONREACTIVE
Hepatitis B Surface Ag: NEGATIVE

## 2014-02-05 LAB — BLOOD GAS, ARTERIAL
Acid-Base Excess: 5.1 mmol/L — ABNORMAL HIGH (ref 0.0–2.0)
Bicarbonate: 29.6 mEq/L — ABNORMAL HIGH (ref 20.0–24.0)
FIO2: 0.35 %
O2 Saturation: 96.8 %
PATIENT TEMPERATURE: 98.6
PEEP/CPAP: 5 cmH2O
PH ART: 7.41 (ref 7.350–7.450)
TCO2: 31.1 mmol/L (ref 0–100)
VT: 500 mL
pCO2 arterial: 47.7 mmHg — ABNORMAL HIGH (ref 35.0–45.0)
pO2, Arterial: 92.9 mmHg (ref 80.0–100.0)

## 2014-02-05 LAB — CBC WITH DIFFERENTIAL/PLATELET
BASOS PCT: 0 % (ref 0–1)
Basophils Absolute: 0 10*3/uL (ref 0.0–0.1)
EOS ABS: 0.1 10*3/uL (ref 0.0–0.7)
Eosinophils Relative: 1 % (ref 0–5)
HEMATOCRIT: 32.2 % — AB (ref 39.0–52.0)
HEMOGLOBIN: 10 g/dL — AB (ref 13.0–17.0)
Lymphocytes Relative: 12 % (ref 12–46)
Lymphs Abs: 0.9 10*3/uL (ref 0.7–4.0)
MCH: 26.7 pg (ref 26.0–34.0)
MCHC: 31.1 g/dL (ref 30.0–36.0)
MCV: 86.1 fL (ref 78.0–100.0)
MONO ABS: 0.7 10*3/uL (ref 0.1–1.0)
MONOS PCT: 10 % (ref 3–12)
Neutro Abs: 5.6 10*3/uL (ref 1.7–7.7)
Neutrophils Relative %: 77 % (ref 43–77)
Platelets: 289 10*3/uL (ref 150–400)
RBC: 3.74 MIL/uL — ABNORMAL LOW (ref 4.22–5.81)
RDW: 17 % — AB (ref 11.5–15.5)
WBC: 7.3 10*3/uL (ref 4.0–10.5)

## 2014-02-05 LAB — COMPREHENSIVE METABOLIC PANEL
ALBUMIN: 2.1 g/dL — AB (ref 3.5–5.2)
ALK PHOS: 97 U/L (ref 39–117)
ALT: 34 U/L (ref 0–53)
ALT: 33 U/L (ref 0–53)
AST: 40 U/L — AB (ref 0–37)
AST: 39 U/L — ABNORMAL HIGH (ref 0–37)
Albumin: 2.1 g/dL — ABNORMAL LOW (ref 3.5–5.2)
Alkaline Phosphatase: 90 U/L (ref 39–117)
Anion gap: 11 (ref 5–15)
Anion gap: 12 (ref 5–15)
BUN: 30 mg/dL — ABNORMAL HIGH (ref 6–23)
BUN: 34 mg/dL — ABNORMAL HIGH (ref 6–23)
CALCIUM: 7.9 mg/dL — AB (ref 8.4–10.5)
CO2: 32 meq/L (ref 19–32)
CO2: 30 meq/L (ref 19–32)
CREATININE: 1.52 mg/dL — AB (ref 0.50–1.35)
Calcium: 8 mg/dL — ABNORMAL LOW (ref 8.4–10.5)
Chloride: 96 mEq/L (ref 96–112)
Chloride: 94 mEq/L — ABNORMAL LOW (ref 96–112)
Creatinine, Ser: 1.38 mg/dL — ABNORMAL HIGH (ref 0.50–1.35)
GFR calc Af Amer: 64 mL/min — ABNORMAL LOW (ref >90)
GFR, EST AFRICAN AMERICAN: 57 mL/min — AB (ref >90)
GFR, EST NON AFRICAN AMERICAN: 55 mL/min — AB (ref >90)
GFR, EST NON AFRICAN AMERICAN: 49 mL/min — AB (ref >90)
Glucose, Bld: 113 mg/dL — ABNORMAL HIGH (ref 70–99)
Glucose, Bld: 128 mg/dL — ABNORMAL HIGH (ref 70–99)
POTASSIUM: 3.8 meq/L (ref 3.7–5.3)
Potassium: 3.7 mEq/L (ref 3.7–5.3)
Sodium: 139 mEq/L (ref 137–147)
Sodium: 136 mEq/L — ABNORMAL LOW (ref 137–147)
Total Bilirubin: 1.2 mg/dL (ref 0.3–1.2)
Total Bilirubin: 1.3 mg/dL — ABNORMAL HIGH (ref 0.3–1.2)
Total Protein: 6.1 g/dL (ref 6.0–8.3)
Total Protein: 6 g/dL (ref 6.0–8.3)

## 2014-02-05 LAB — PHOSPHORUS: Phosphorus: 4.8 mg/dL — ABNORMAL HIGH (ref 2.3–4.6)

## 2014-02-05 LAB — CULTURE, RESPIRATORY

## 2014-02-05 LAB — IRON AND TIBC
Iron: 15 ug/dL — ABNORMAL LOW (ref 42–135)
Saturation Ratios: 7 % — ABNORMAL LOW (ref 20–55)
TIBC: 208 ug/dL — ABNORMAL LOW (ref 215–435)
UIBC: 193 ug/dL (ref 125–400)

## 2014-02-05 LAB — CK
Total CK: 363 U/L — ABNORMAL HIGH (ref 7–232)
Total CK: 353 U/L — ABNORMAL HIGH (ref 7–232)

## 2014-02-05 LAB — BASIC METABOLIC PANEL
Anion gap: 11 (ref 5–15)
BUN: 25 mg/dL — AB (ref 6–23)
CHLORIDE: 98 meq/L (ref 96–112)
CO2: 31 mEq/L (ref 19–32)
Calcium: 7.7 mg/dL — ABNORMAL LOW (ref 8.4–10.5)
Creatinine, Ser: 1.26 mg/dL (ref 0.50–1.35)
GFR calc non Af Amer: 61 mL/min — ABNORMAL LOW (ref >90)
GFR, EST AFRICAN AMERICAN: 71 mL/min — AB (ref >90)
GLUCOSE: 112 mg/dL — AB (ref 70–99)
Potassium: 3.3 mEq/L — ABNORMAL LOW (ref 3.7–5.3)
Sodium: 140 mEq/L (ref 137–147)

## 2014-02-05 LAB — CULTURE, RESPIRATORY W GRAM STAIN

## 2014-02-05 LAB — MAGNESIUM: Magnesium: 2.3 mg/dL (ref 1.5–2.5)

## 2014-02-05 LAB — VITAMIN B12: Vitamin B-12: 522 pg/mL (ref 211–911)

## 2014-02-05 LAB — HEMOGLOBIN A1C
HEMOGLOBIN A1C: 5.9 % — AB (ref <5.7)
MEAN PLASMA GLUCOSE: 123 mg/dL — AB (ref <117)

## 2014-02-05 LAB — AMMONIA: AMMONIA: 38 umol/L (ref 11–60)

## 2014-02-05 LAB — TRANSFERRIN: Transferrin: 164 mg/dL — ABNORMAL LOW (ref 200–360)

## 2014-02-05 LAB — FERRITIN: Ferritin: 483 ng/mL — ABNORMAL HIGH (ref 22–322)

## 2014-02-05 NOTE — Progress Notes (Addendum)
Subjective: Pt unable to participate in ROS 2/2 acute encephalopathy and VDRF.  Objective: Vital signs in last 24 hours:   Tmax:  98.52F BP:  120/82-154/80 HR:  88-94 RR:  18-28 Pulse Ox: 87-98% on Vent I/O:  1130/3700 (-bal)  Physical Exam: General: Ill appearing,in NAD  HEENT: PERRLA EOMI bilaterally. Sclera anicteric bilaterally. MMM  NECK: Supple, No JVD or LAD, + Trach midline and secure. #8 shiley  Lungs: Symmetrical chest rise. Scatter wheezes and rhonchi  CV: Normal S1, S2. No m/c/r  Abdomen: Protuberant, however S/NT/ND +BS X 4. No rebound, guarding. +PEG secure and C/D/I  EXT: No cyanosis or clubbing, 2+ edema in extremities U>L and scrotum  Skin: Warm/dry and intact  Neuro: grossly intact and SMAEs, unable to perform formal CN exam 2/2 acute encephalopathy   Lab Results reviewed.  Sedimentation Rate No results found for this basename: ESRSEDRATE,  in the last 72 hours C-Reactive Protein No results found for this basename: CRP,  in the last 72 hours  Microbiology: Recent Results (from the past 240 hour(s))  CULTURE, RESPIRATORY (NON-EXPECTORATED)     Status: None   Collection Time    02/02/14 11:49 AM      Result Value Ref Range Status   Specimen Description TRACHEAL ASPIRATE   Final   Special Requests NONE   Final   Gram Stain     Final   Value: FEW WBC PRESENT,BOTH PMN AND MONONUCLEAR     RARE SQUAMOUS EPITHELIAL CELLS PRESENT     RARE GRAM POSITIVE COCCI     IN PAIRS IN CLUSTERS     Performed at [facility]   Culture     Final   Value: MODERATE GRAM NEGATIVE RODS     Performed at [facility]   Report Status PENDING   Incomplete  URINE CULTURE     Status: None   Collection Time    02/02/14 11:50 AM      Result Value Ref Range Status   Specimen Description URINE, RANDOM   Final   Special Requests NONE   Final   Culture  Setup Time     Final   Value: 02/02/2014 13:10     Performed at [place]     Final   Value: NO GROWTH     Performed at [facility]   Culture     Final   Value: NO GROWTH     Performed at [facility]   Report Status 02/03/2014 FINAL   Final  WOUND CULTURE     Status: None   Collection Time    02/02/14  1:54 PM      Result Value Ref Range Status   Specimen Description WOUND TRACHEAL SITE   Final   Special Requests Normal   Final   Gram Stain     Final   Value: RARE WBC PRESENT, PREDOMINANTLY MONONUCLEAR     NO SQUAMOUS EPITHELIAL CELLS SEEN     NO ORGANISMS SEEN     Performed at [facility]   Culture     Final   Value: MULTIPLE ORGANISMS PRESENT, NONE PREDOMINANT     Note: NO STAPHYLOCOCCUS AUREUS ISOLATED NO GROUP A STREP (S.PYOGENES) ISOLATED     Performed at [facility]   Report Status 02/04/2014 FINAL   Final  CULTURE, BLOOD (ROUTINE X 2)     Status: None   Collection Time    02/02/14  2:10 PM  Result Value Ref Range Status   Specimen Description BLOOD PICC LINE   Final   Special Requests BOTTLES DRAWN AEROBIC AND ANAEROBIC 10CC   Final   Culture  Setup Time     Final   Value: 02/02/2014 19:53     Performed at [facility]   Culture     Final   Value:        BLOOD CULTURE RECEIVED NO GROWTH TO DATE CULTURE WILL BE HELD FOR 5 DAYS BEFORE ISSUING A FINAL NEGATIVE REPORT     Performed at [facility]   Report Status PENDING   Incomplete    Medications: I have reviewed the patient's current medications.  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/h/o Lung Cancer s/p Lobectomy: -Continue vent weaning protocol; ABG today -continue Performist and Duonebs with prn BD -pulm following -pan cultures ordered and culture of green secretions coming from trach, likely GI contents, see below  Acute Ileus/PEM/Dysphagia: -NGT to LIS, and start TPN -continue TPN for now and hopeful begin titration to off tomorrow, RD to  assist with TF and TPN needs -NOTE: order written to clamp NGT for Brilinta administration given recent PTCA -d/c fecal tube and check for impaction  Acute on Chronic Constipation: -bowel regimen initiated to include: miralax, and senna-s (d/c colace as no results produced) -hypothyroidism and constipation contributor of acute ileus  Toxic Encephalopathy/Chronic Alcohol Abuse: -Ordered haldol 5mg  q12h and ativan 1mg  q8h, and prn ativan -lab survey: CMP, mg, phos and normalize if able -start thiamine and folate supplementation (may add to TPN if able)  Hypokalemia: -replete, goal 4.0, recheck post infusion in 4h and in am; cont potassium replacement protocol, follow mag level and replete if <2.0  CAD s/p PTCA/HLP: -continue cardiac supportive medications benefits vs risk of continuing Brilinta daily as ordered per cardiology at OSH.   Anemia of chronic disease, microcytic/PUD with Recurrent GI Bleed (today): -transfuse 2U PRBC and recheck H/H post transfusion, unfortunately pt elected not to get recommended colonoscopy that was discussed PTA and the one he did take he was inadequately prepared; he is supposed to get colonoscopy post d/c; high likelihood of colonic neoplasm given current lung cancer, will defer to this plan as long as pt remains hemodynamically stable -premedicate before transfusion -cont Protonix 40mg  IV BID  HTN, uncontrolled: -will not increase BP medications today given need for blood transfusion as note to ppt hypotension -monitor for acute GI bleed and accelerated BP curve, reassess in am and add medi  MDD vs Mood Disorder: -can titrate upward Effexor dose as needed, for now continue 75mg  BID  Hypothyroidism: -adjust levothyroxine dose to 12.66mcg IV daily while on bowel rest and then will prescribe 13mcg daily; recheck TSH in 4 weeks and adjust dose accordingly; may warrant further workup esp with cancer history as this is a new diagnosis for pt  Generalized  Weakness: -PT/OT eval and tx when able  GI proph/Gastritis/PUD: Protonix 40mg  IV BID/probiotic VTE proph:   lovenox may need to d/c if cont H/H decreasing or CBC related intolerance   Critical Care Time: 40 minutes  Howis Y Toler 02/02/2014, 11:26 PM

## 2014-02-05 NOTE — Progress Notes (Addendum)
Subjective: Pt unable to participate in ROS 2/2 acute encephalopathy and VDRF.  Objective: Vital signs in last 24 hours:   Tmax:  98.47F BP:  93/60-133/86 HR:  57-76 RR:  17-26 Pulse Ox: 94-98% on Vent I/O:  2140/1000  Physical Exam: General: Ill appearing,in NAD HEENT: PERRLA EOMI bilaterally. Sclera anicteric bilaterally. MMM NECK: Supple, No JVD or LAD, + Trach midline and secure. #8 shiley Lungs: Symmetrical chest rise. Scatter wheezes and rhonchi CV: Normal S1, S2. No m/c/r Abdomen: Protuberant, however S/NT/ND +BS X 4. No rebound, guarding. +PEG secure and C/D/I EXT: No cyanosis or clubbing, only trace edema in extremities U>L and scrotum Skin: Warm/dry and intact Neuro: grossly intact and SMAEs, unable to perform formal CN exam 2/2 acute encephalopathy   Lab Results  Recent Labs  02/04/14 1030  02/05/14 0627 02/05/14 1110  WBC 8.7  --  7.3  --   HGB 10.2*  --  10.0*  --   HCT 31.2*  --  32.2*  --   NA 141  < > 139 136*  K 3.1*  < > 3.8 3.7  CL 96  < > 96 94*  CO2 35*  < > 32 30  BUN 20  < > 30* 34*  CREATININE 0.72  < > 1.38* 1.52*  < > = values in this interval not displayed. Liver Panel  Recent Labs  02/05/14 0627 02/05/14 1110  PROT 6.1 6.0  ALBUMIN 2.1* 2.1*  AST 40* 39*  ALT 34 33  ALKPHOS 97 90  BILITOT 1.2 1.3*   Sedimentation Rate No results found for this basename: ESRSEDRATE,  in the last 72 hours C-Reactive Protein No results found for this basename: CRP,  in the last 72 hours  Microbiology: Recent Results (from the past 240 hour(s))  CULTURE, RESPIRATORY (NON-EXPECTORATED)     Status: None   Collection Time    02/02/14 11:49 AM      Result Value Ref Range Status   Specimen Description TRACHEAL ASPIRATE   Final   Special Requests NONE   Final   Gram Stain     Final   Value: FEW WBC PRESENT,BOTH PMN AND MONONUCLEAR     RARE SQUAMOUS EPITHELIAL CELLS PRESENT     RARE GRAM POSITIVE COCCI     IN PAIRS IN CLUSTERS     Performed at  [facility]   Culture     Final   Value: MODERATE PSEUDOMONAS PUTIDA     Performed at [facility]   Report Status 02/05/2014 FINAL   Final   Organism ID, Bacteria PSEUDOMONAS PUTIDA   Final  URINE CULTURE     Status: None   Collection Time    02/02/14 11:50 AM      Result Value Ref Range Status   Specimen Description URINE, RANDOM   Final   Special Requests NONE   Final   Culture  Setup Time     Final   Value: 02/02/2014 13:10     Performed at [place]     Final   Value: NO GROWTH     Performed at [facility]   Culture     Final   Value: NO GROWTH     Performed at [facility]   Report Status 02/03/2014 FINAL   Final  WOUND CULTURE     Status: None   Collection Time    02/02/14  1:54 PM      Result  Value Ref Range Status   Specimen Description WOUND TRACHEAL SITE   Final   Special Requests Normal   Final   Gram Stain     Final   Value: RARE WBC PRESENT, PREDOMINANTLY MONONUCLEAR     NO SQUAMOUS EPITHELIAL CELLS SEEN     NO ORGANISMS SEEN     Performed at [facility]   Culture     Final   Value: MULTIPLE ORGANISMS PRESENT, NONE PREDOMINANT     Note: NO STAPHYLOCOCCUS AUREUS ISOLATED NO GROUP A STREP (S.PYOGENES) ISOLATED     Performed at [facility]   Report Status 02/04/2014 FINAL   Final  CULTURE, BLOOD (ROUTINE X 2)     Status: None   Collection Time    02/02/14  2:10 PM      Result Value Ref Range Status   Specimen Description BLOOD PICC LINE   Final   Special Requests BOTTLES DRAWN AEROBIC AND ANAEROBIC 10CC   Final   Culture  Setup Time     Final   Value: 02/02/2014 19:53     Performed at [facility]   Culture     Final   Value:        BLOOD CULTURE RECEIVED NO GROWTH TO DATE CULTURE WILL BE HELD FOR 5 DAYS BEFORE ISSUING A FINAL NEGATIVE REPORT     Performed at [facility]   Report Status PENDING   Incomplete    Studies/Results: Dg Chest Port 1  View  02/05/2014   CLINICAL DATA:  Hypoxia  EXAM: PORTABLE CHEST - 1 VIEW  COMPARISON:  02/05/2014  FINDINGS: Cardiac shadow is stable. Postsurgical changes are again seen. Tracheostomy tube is noted in satisfactory position. A right-sided PICC line is again noted and stable. Mild atelectatic changes are noted along the minor fissure on the right. Stable atelectatic changes are noted in the left base.  IMPRESSION: Slight increase in atelectatic changes in the right upper lobe along the minor fissure. The remainder of the exam is stable.   Electronically Signed   By: Inez Catalina M.D.   On: 02/05/2014 12:50   Dg Chest Port 1 View  02/05/2014   CLINICAL DATA:  Respiratory distress. History of COPD with lung cancer.  EXAM: PORTABLE CHEST - 1 VIEW  COMPARISON:  02/02/2014.  FINDINGS: Normal cardiomediastinal silhouette. Chronic changes of COPD. PICC line tip cavoatrial junction. Stable LEFT suprahilar surgical clips. Mild LEFT subsegmental atelectasis, roughly stable. Mild vascular congestion without areas of focal consolidation. Stable blunting LEFT CP angle. No pneumothorax. Tracheostomy good position.  IMPRESSION: Stable chest.   Electronically Signed   By: Rolla Flatten M.D.   On: 02/05/2014 08:03   Dg Abd Portable 1v  02/05/2014   CLINICAL DATA:  Abdominal discomfort, ileus  EXAM: PORTABLE ABDOMEN - 1 VIEW  COMPARISON:  Portable abdominal films of September 26 and 27, 2015.  FINDINGS: This single portable film excludes the mid pelvis and below and portions of the right aspect of the abdomen. There remain loops of mildly distended gas-filled small bowel. There is a small amount of colonic gas. The gas -gastrostomy tube bulb is again demonstrated. There are surgical clips in the gallbladder fossa. There is atelectasis and likely a small amount of pleural fluid at the left lung base.  IMPRESSION: Persistent gaseous distention of small-bowel loops consistent with ileus or partial distal obstruction. No free  extraluminal collections are demonstrated.   Electronically Signed   By: David  Martinique  On: 02/05/2014 08:02    Medications: I have reviewed the patient's current medications.  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/h/o Lung Cancer s/p Lobectomy: Septic Shock, PTA: -ABG and pCXR today...adjust tx plan based on results if needed -pan cultures update (collected on 9/25): adjust tx plan accordingly if susceptibility/sensitivies result  Tracheal aspirate:  Blood Cultures:  Urine Culture:  -Continue vent weaning protocol; wean PEEP/FIO2 for sats > 92%; Venous Duplex today given ongoing hypoxia, LE edema, pain and erythema  -continue Performist and Duonebs with prn BD -pulm following -follow up with ENT / ONC regarding tonsillar CA  Post d/c from LTAC, d/w sister Juliann Pulse) and mother that pt will not be receiving radiation tx while in LTAC; will have ENT consult once VDRF is resolved NOTE: pulm recommended NOT to decannulate even if liberated from mechanical ventilation until input received from ENT, family aware and has pt ENT's name or we can consider one to coordinate care from host hospital.  Acute Ileus/PEM/Dysphagia: -resolved (see KUB) and resume TF per PEG and wean TPN to off, per RD/Pharm -avoid constipation and monitor daily bowel movements, see above -ST eval when resolve encephalopathy  Toxic EncephalopathyAlcoholism/Chronic Alcohol Abuse with possible Delirium Tremens:  -checked ammonia today...if elevated will get CT of Abd/Pelvis given acute ileus, possible cirrhosis and protuburent abdomen and initiate rifaximin and lactulose therapy. -alcohol abuse contributor of toxic encephalopathy, continue supportive care per tx plan and thiamine and folate daily supplementation; prn ativan and haldol ordered for increased anxiety and/or psychosis. Start Librium 25mg  PO QID and stop  scheduled haldol and ativan. -contributor of hypophos/mag/kalemia, normalize electrolytes so unfortunately until stabilized daily CMP, mg, phos recommended. -monitor ongoing tolerance of statin that was likely started post AMI.  Hypomagnesium: -mag sulfate 2gm IV x 1 yesterday and cont daily mag oxide 400mg  BID; goal 2.0 given CAD  Hypophosphotemia: -given difficulty to wean from vent with ongoing hypoxia, pt started on Phos-Nak 1pkt QID yesterday evening in hopes of maintaining normal level vs emergent KPhos infusions, continue telemetry for now.  Hypokalemia: -replete if needed, goal 4.0, recheck daily for now until proved stabilization and reduce the risk of hyperkalemia with scheduled potassium chloride order (KCl 50meq daily), titrate according to lasix use  Hyperglycemia: -Accuchecks q 6h, SSI low dose, hbgA1c pending -Levemir 5Units SQ daily being tolerated and can be titrated to goals  Alcoholic Hepatitis/Hepatis C hx: -resolving, acute hepatitis panel and GGT results pending. Discussed with family quantity of drinking.  Family stated that pt consumed large amounts of alcohol prior to going for procedure (recent hospitalization per admission H and p) which would explain resolving alcoholic hepatitis.  Also he drinks at least a 12-24 cans of beer weekly per his mother because she recently just bought 4-12 packs within the last 3 weeks; as pt is a self-proclaimed recluse x 2 years and distrustful of the medical community (as they forced him to go to doctor that prompted most recent hospitalization) which I explained unfortunately makes it difficult to refrain from ongoing alcohol and tobacco abuse.   Rhabdomyolysis: -noted and treated PTA, recheck CPK in am esp given statin use and non-specific pain  Anasarca/Hypoalbuminemia: -lymphedema consult today -d/c scheduled lasix (40mg  IV BID) given newly developed pre-renal azotemia; consider 60mg  or 80mg  IV qam if restart needed given  refractory edema -trial of Albumin 25g x 3 doses (with first dose to be given concurrent with Lasix 60mg  IV x 1) given its  oncotic properties in hopes of eliminated excessive accumulate volume  in this severely critically ill patient with severe hypoalbuminemia and peripheral edema.  CAD s/p PTCA/HLP: -continue cardiac supportive medications (BB, ASA, ACE-I, Diuretic) and titrate to goal and tolerance; benefits vs risk of continuing Brilinta daily as ordered per cardiology at OSH.   HTN, uncontrolled: improved with med titration -now normotensive; titrate anti-hypertensive agents to goal and tolerance using caution with increase of beta-blocker given risk of ppt bradycardia, observe for clinical effect  Anemia of chronic disease, microcytic/PUD: -H/H stable w/o s/s of GI bleed, cont ferrous sulfate + vitamin C BID, recheck in 3 months per PCP -checked iron panel, B12  Chronic Back Pain with Acute Pain Crisis likely post CODE BLUE: -change Fentanyl patch from 61mcg to 5mcg q 72h x 3 days then off, then start oxycodone 5mg  IR PO qam. Informed family that pt can not take NSAIDS (he was taking Aleve and Ibuprofen PTA for chronic back pain) given AMI/CAD and PUD. Therefore, he will likely require daily opiate therapy post d/c for back pain, will defer to PCP/Pain mgmt for definitive outpt treatment plan.. Also would consider bone mets given cancer dx and would recommend PET scan as outpt if not recently done, defer to ONC  MDD/Mood Disorder: -can titrate upward Effexor dose as needed, for now continue 75mg  BID and would recommend outpt psych consult as pt would benefit from counseling and psych medication mgmt.  Also d/w family that he planned on resuming his reclusive lifestyle and distrust to the medical community (to the point he would pick and choose which of his healthcare providers he chose to f/u with) could result in increased morbidity and event a terminal mortality event given severe occlusive  CAD resulting in PTCA placement and need to continue anti-platelet therapy as instruction. Therefore, he should not resume alcohol and tobacco abuse.  Tobacco Abuse Disorder, continuous: -ordered Nicotine Patch 21mg  daily as may assist with increased anxiety and orders placed for smoking cessation counseling when pt is able to comprehend  Hypothyroidism: -continue levothyroxine 45mcg daily; recommend recheck TSH in 4 weeks and adjust dose accordingly; may warrant further workup as this is a new diagnosis for pt  Vitamin D Deficiency: -cont supplementation and recheck level in 4 weeks and adjust dose accordingly  Generalized Weakness: -PT/OT eval and tx when able  GI proph/Gastritis/PUD: Protonix 40mg  IV BID/probiotic VTE proph:   Heparin 5000U SQ BID given high bleeding risk with anti-platelet medication  Family Communication: 2 Sisters (Juliann Pulse and Stanton) that live in [place], [place] and Mother (living w/daughter now while pt is hospitalized as mother lives in [place] with pt (prefers to be called Butch):  I informed Juliann Pulse that they need to provide evidence of MPOA/Advanced Directed that Juliann Pulse told me her sister Jackelyn Poling has???  Deferred them to our case mgmt team for completion of advanced directive.   NOTE: Patient HAS NOT developed an advanced directive with an attorney.   Critical Care Time: 45 minutes  Howis Y Toler 02/06/2014, 7:46 AM

## 2014-02-05 NOTE — Progress Notes (Addendum)
Subjective: Pt unable to participate in ROS 2/2 acute encephalopathy and VDRF.  Objective: Vital signs in last 24 hours:   Tmax:  99.74F BP:  118/81-178/85 HR:  64-87 RR:  18-24 Pulse Ox: 92-99% on Vent I/O:  1480/1600 (-bal)  Physical Exam: General: Ill appearing,in NAD  HEENT: PERRLA EOMI bilaterally. Sclera anicteric bilaterally. MMM NECK: Supple, No JVD or LAD, + Trach midline and secure. #8 shiley  Lungs: Symmetrical chest rise. Scatter wheezes and rhonchi  CV: Normal S1, S2. No m/c/r  Abdomen: Protuberant, however S/NT/ND +BS X 4. No rebound, guarding. +PEG secure and C/D/I  EXT: No cyanosis or clubbing, 2+ edema in extremities U>L and scrotum  Skin: Warm/dry and intact  Neuro: grossly intact and SMAEs, unable to perform formal CN exam 2/2 acute encephalopathy   Lab Results reviewed.  Sedimentation Rate No results found for this basename: ESRSEDRATE,  in the last 72 hours C-Reactive Protein No results found for this basename: CRP,  in the last 72 hours  Microbiology: Recent Results (from the past 240 hour(s))  CULTURE, RESPIRATORY (NON-EXPECTORATED)     Status: None   Collection Time    02/02/14 11:49 AM      Result Value Ref Range Status   Specimen Description TRACHEAL ASPIRATE   Final   Special Requests NONE   Final   Gram Stain     Final   Value: FEW WBC PRESENT,BOTH PMN AND MONONUCLEAR     RARE SQUAMOUS EPITHELIAL CELLS PRESENT     RARE GRAM POSITIVE COCCI     IN PAIRS IN CLUSTERS     Performed at [facility]   Culture     Final   Value: MODERATE GRAM NEGATIVE RODS     Performed at [facility]   Report Status PENDING   Incomplete  URINE CULTURE     Status: None   Collection Time    02/02/14 11:50 AM      Result Value Ref Range Status   Specimen Description URINE, RANDOM   Final   Special Requests NONE   Final   Culture  Setup Time     Final   Value: 02/02/2014 13:10     Performed at [facility]     Final    Value: NO GROWTH     Performed at [facility]   Culture     Final   Value: NO GROWTH     Performed at [facility]   Report Status 02/03/2014 FINAL   Final  WOUND CULTURE     Status: None   Collection Time    02/02/14  1:54 PM      Result Value Ref Range Status   Specimen Description WOUND TRACHEAL SITE   Final   Special Requests Normal   Final   Gram Stain     Final   Value: RARE WBC PRESENT, PREDOMINANTLY MONONUCLEAR     NO SQUAMOUS EPITHELIAL CELLS SEEN     NO ORGANISMS SEEN     Performed at [facility]   Culture     Final   Value: MULTIPLE ORGANISMS PRESENT, NONE PREDOMINANT     Note: NO STAPHYLOCOCCUS AUREUS ISOLATED NO GROUP A STREP (S.PYOGENES) ISOLATED     Performed at [facility]   Report Status 02/04/2014 FINAL   Final  CULTURE, BLOOD (ROUTINE X 2)     Status: None   Collection Time    02/02/14  2:10 PM  Result Value Ref Range Status   Specimen Description BLOOD PICC LINE   Final   Special Requests BOTTLES DRAWN AEROBIC AND ANAEROBIC 10CC   Final   Culture  Setup Time     Final   Value: 02/02/2014 19:53     Performed at [facility]   Culture     Final   Value:        BLOOD CULTURE RECEIVED NO GROWTH TO DATE CULTURE WILL BE HELD FOR 5 DAYS BEFORE ISSUING A FINAL NEGATIVE REPORT     Performed at [facility]   Report Status PENDING   Incomplete    Studies/Results:  Medications: I have reviewed the patient's current medications.  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/h/o Lung Cancer s/p Lobectomy: -Continue vent weaning protocol; wean PEEP/FIO2 for sats > 92%; decrease secretions today -continue Performist and Duonebs with prn BD -d/w sister Juliann Pulse) and mother that pt will not be receiving radiation tx while in LTAC; defer to [place] post d/c -pan cultures ordered yesterday: negative to date  Acute  Ileus/PEM/Dysphagia:: -resolving, continue bowel rest today and serial KUB; cont TPN for now and limited PEG use - RD/Pharmacy to assist with TPN needs  Toxic Encephalopathy/Chronic Alcohol Abuse: -alcoholism a contributor of toxic encephalopathy, continue supportive care per tx plan and thiamine and folate daily supplementation; ativan and haldol as needed.  -normalize electrolytes  Anemia of chronic disease, microcytic/PUD:  -H/H stable post 2U PRBC transfused yesterday. -monitor for s/s of acute bleeding and continue PUD treatment -monitor CBC prn given high bleeding risk, especially with lovenox, Brilinta and ASA.  Hypokalemia: -aggressively replete given recent AMI, goal 4.0, recheck post infusion in 4h and in am; also check mag level  Anasarca: -lymphedema consult and mgmt Monday 9/28, cont lasix as tolerated, good UOP; lasix as noted below  CAD s/p PTCA/HLP: -continue cardiac supportive medications (anti-platelet, BB, ASA, ACE-I, Diuretic) and titrate to goal and tolerance;   HTN, uncontrolled: -titrate anti-hypertensive agents to goal and tolerance; increased Lasix to 40mg  IV BID; added hydralazine 25mg  TID and increased Lisinopril 40mg  daily.  Hyperglycemia: -check hbgA1c and start Levemir 5 Units SQ daily  PEM/Dysphagia: -PEG tube placed PTA, ST MBBS when able (resolution of acute encephalopathy), RD recommendations and continue aspiration precautions  Generalized Weakness: -PT/OT eval and tx  GI proph/Gastritis/PUD: Protonix 40mg  IV BID/probiotic VTE proph:   Lovenox   Critical Care Time: 35 minutes  Howis Y Toler 02/03/2014, 2:25 PM

## 2014-02-05 NOTE — Progress Notes (Signed)
Name: Bradley Doyle MRN: [id number] DOB: Aug 08, 1955    ADMISSION DATE:  01/22/2014 CONSULTATION DATE:  01/16/2014  REFERRING MD :  Dr. Laren Everts PRIMARY SERVICE:  [facility]   CHIEF COMPLAINT:  Respiratory Failure, Tracheostomy status, Ventilator Dependence   BRIEF PATIENT DESCRIPTION: 58 y/o M admitted to [facility] from [facility] after prolonged hospitalization related to STEMI + Cardiac Arrest after a tonsillectomy (L) due to invasive tonsillar cancer.    SIGNIFICANT EVENTS / STUDIES:  9/23  Admit to [facility] from [facility] post STEMI & cardiac arrest after a tonsillectomy for invasive tonsillar CA 9/28 pressure limits vent on PRVC  LINES / TUBES: # 8 Trach >>   CULTURES: Cult[facility] >> negative (blood, urine, sputum)  ANTIBIOTICS: Per [facility] MD  SUBJECTIVE: Full vent support  VITAL SIGNS: Vital signs reviewed. Abnormal values will appear under impression plan section.    PHYSICAL EXAMINATION: General:  Chronically ill in NAD Neuro:  Awake, alert, nods appropriately, MAE, generalized weakness  HEENT:  Mm pink/moist, no JVD, #8 trach midline c/d/i Cardiovascular:  s1s2 rrr, no m/r/g Lungs:  resp's even/non-labored, lungs bilaterally with scattered wheezing  Abdomen:  Distended, bsx4 active, PEG to suction on tna Musculoskeletal:  No acute deformities  Skin:  Warm/dry, no edema   Recent Labs Lab 02/04/14 1030 02/05/14 0031 02/05/14 0627  NA 141 140 139  K 3.1* 3.3* 3.8  CL 96 98 96  CO2 35* 31 32  BUN 20 25* 30*  CREATININE 0.72 1.26 1.38*  GLUCOSE 105* 112* 113*    Recent Labs Lab 02/03/14 1445 02/04/14 1030 02/05/14 0627  HGB 10.5* 10.2* 10.0*  HCT 32.6* 31.2* 32.2*  WBC 6.6 8.7 7.3  PLT 231 256 289   Dg Chest Port 1 View  02/05/2014   CLINICAL DATA:  Respiratory distress. History of COPD with lung cancer.  EXAM: PORTABLE CHEST - 1 VIEW  COMPARISON:  02/02/2014.  FINDINGS: Normal cardiomediastinal silhouette. Chronic changes of COPD.  PICC line tip cavoatrial junction. Stable LEFT suprahilar surgical clips. Mild LEFT subsegmental atelectasis, roughly stable. Mild vascular congestion without areas of focal consolidation. Stable blunting LEFT CP angle. No pneumothorax. Tracheostomy good position.  IMPRESSION: Stable chest.   Electronically Signed   By: Rolla Flatten M.D.   On: 02/05/2014 08:03   Dg Abd Portable 1v  02/05/2014   CLINICAL DATA:  Abdominal discomfort, ileus  EXAM: PORTABLE ABDOMEN - 1 VIEW  COMPARISON:  Portable abdominal films of September 26 and 27, 2015.  FINDINGS: This single portable film excludes the mid pelvis and below and portions of the right aspect of the abdomen. There remain loops of mildly distended gas-filled small bowel. There is a small amount of colonic gas. The gas -gastrostomy tube bulb is again demonstrated. There are surgical clips in the gallbladder fossa. There is atelectasis and likely a small amount of pleural fluid at the left lung base.  IMPRESSION: Persistent gaseous distention of small-bowel loops consistent with ileus or partial distal obstruction. No free extraluminal collections are demonstrated.   Electronically Signed   By: David  Martinique   On: 02/05/2014 08:02   Dg Abd Portable 1v  02/04/2014   CLINICAL DATA:  Small bowel obstruction  EXAM: PORTABLE ABDOMEN - 1 VIEW  COMPARISON:  Portable exam 0641 hr compared to 02/03/2014  FINDINGS: Gastrostomy tube noted in LEFT upper quadrant.  Air-filled loops of bowel in the mid abdomen suspect transverse colon.  Small bowel gas pattern is normal without definite  small bowel dilatation or wall thickening.  Gas present to the rectum.  Surgical clips RIGHT lower quadrant.  Embolization coils in RIGHT upper quadrant.  Bones demineralized.  No definite urinary tract calcification.  IMPRESSION: Gastrostomy tube.  Mild gaseous distention of the transverse colon without definite small bowel dilatation.   Electronically Signed   By: Lavonia Dana M.D.   On:  02/04/2014 08:44    ASSESSMENT / PLAN:  58 y/o M admitted to [facility] after admission to [facility] for tonsillectomy in the setting of invasive squamous cell carcinoma.  Hx of lung cancer s/p lobectomy (unknown type).  Course complicated by NSTEMI & cardiac arrest in PACU.  S/P PCTA to LAD.  Trach / PEG on 9/18.  Tx to [facility] on 9/23.     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 Timing & type)  Plan: Continue with weaning as tolerated but not weaning today Wean PEEP/FiO2 for sats > 92% F/u CXR   Duonebs Q6 with PRN albuterol Q3 Mobilize as able Will need to follow up with ENT / ONC regarding tonsillar CA Would not decannulate even if liberated from mechanical ventilation until input received from ENT  NSTEMI CAD s/p Stent to LAD HTN  Plan: Per primary  Hx GIB  Ileus  Plan: Monitor for bleeding TNA per [place] ACNP Maryanna Shape PCCM Pager [phone] till 3 pm If no answer page [phone] 02/05/2014, 9:57 AM   STAFF NOTE  - saw patient.  58 y/o M admitted to [facility] after admission to [facility] for tonsillectomy in the setting of invasive squamous cell carcinoma.  Hx of lung cancer s/p lobectomy (unknown type).  Course complicated by NSTEMI & cardiac arrest in PACU.  S/P PCTA to LAD.  Trach / PEG on 9/18. See NP Notes for details. WOuld avoid decannulation   Dr. Brand Males, M.D., [facility]C.P Pulmonary and Critical Care Medicine Staff Physician [place] [facility] Pager: [phone], If no answer or between  15:00h - 7:00h: call [phone]  02/05/2014 12:33 PM

## 2014-02-06 LAB — CBC WITH DIFFERENTIAL/PLATELET
Basophils Absolute: 0 K/uL (ref 0.0–0.1)
Basophils Relative: 0 % (ref 0–1)
Eosinophils Absolute: 0.1 K/uL (ref 0.0–0.7)
Eosinophils Relative: 1 % (ref 0–5)
HCT: 30.3 % — ABNORMAL LOW (ref 39.0–52.0)
Hemoglobin: 9.6 g/dL — ABNORMAL LOW (ref 13.0–17.0)
Lymphocytes Relative: 10 % — ABNORMAL LOW (ref 12–46)
Lymphs Abs: 0.7 K/uL (ref 0.7–4.0)
MCH: 27.8 pg (ref 26.0–34.0)
MCHC: 31.7 g/dL (ref 30.0–36.0)
MCV: 87.8 fL (ref 78.0–100.0)
Monocytes Absolute: 0.6 K/uL (ref 0.1–1.0)
Monocytes Relative: 8 % (ref 3–12)
Neutro Abs: 6 K/uL (ref 1.7–7.7)
Neutrophils Relative %: 81 % — ABNORMAL HIGH (ref 43–77)
Platelets: 274 K/uL (ref 150–400)
RBC: 3.45 MIL/uL — ABNORMAL LOW (ref 4.22–5.81)
RDW: 17.3 % — ABNORMAL HIGH (ref 11.5–15.5)
WBC: 7.4 K/uL (ref 4.0–10.5)

## 2014-02-06 LAB — COMPREHENSIVE METABOLIC PANEL WITH GFR
ALT: 26 U/L (ref 0–53)
AST: 34 U/L (ref 0–37)
Albumin: 2.7 g/dL — ABNORMAL LOW (ref 3.5–5.2)
Alkaline Phosphatase: 97 U/L (ref 39–117)
Anion gap: 11 (ref 5–15)
BUN: 25 mg/dL — ABNORMAL HIGH (ref 6–23)
CO2: 30 meq/L (ref 19–32)
Calcium: 7.8 mg/dL — ABNORMAL LOW (ref 8.4–10.5)
Chloride: 96 meq/L (ref 96–112)
Creatinine, Ser: 0.97 mg/dL (ref 0.50–1.35)
GFR calc Af Amer: >90 mL/min
GFR calc non Af Amer: >90 mL/min
Glucose, Bld: 104 mg/dL — ABNORMAL HIGH (ref 70–99)
Potassium: 3.3 meq/L — ABNORMAL LOW (ref 3.7–5.3)
Sodium: 137 meq/L (ref 137–147)
Total Bilirubin: 2.1 mg/dL — ABNORMAL HIGH (ref 0.3–1.2)
Total Protein: 6.2 g/dL (ref 6.0–8.3)

## 2014-02-06 LAB — PHOSPHORUS: Phosphorus: 3.5 mg/dL (ref 2.3–4.6)

## 2014-02-06 LAB — MAGNESIUM: MAGNESIUM: 2 mg/dL (ref 1.5–2.5)

## 2014-02-06 NOTE — Progress Notes (Signed)
Subjective: Pt unable to participate in ROS 2/2 acute encephalopathy and VDRF.  Objective: Vital signs in last 24 hours:   T              97.6 BP:  107/73 HR:  70 RR:  19 Pulse Ox: 100% on Vent I/O:  Unknown  Physical Exam: General: Ill appearing, confused HEENT: PERRLA EOMI bilaterally. Sclera anicteric bilaterally. MMM NECK: Supple, No JVD or LAD, + Trach midline and secure. #8 shiley Lungs: Symmetrical chest rise. Scatter wheezes and rhonchi CV: Normal S1, S2. No m/c/r Abdomen: Protuberant, however S/NT/ND +BS X 4. No rebound, guarding. +PEG secure and C/D/I EXT: No cyanosis or clubbing, only trace edema in extremities U>L and scrotum Skin: Warm/dry and intact Neuro: grossly intact and SMAEs, unable to perform formal CN exam 2/2 acute encephalopathy   Lab Results  Recent Labs  02/05/14 0627 02/05/14 1110 02/06/14 0720  WBC 7.3  --  7.4  HGB 10.0*  --  9.6*  HCT 32.2*  --  30.3*  NA 139 136* 137  K 3.8 3.7 3.3*  CL 96 94* 96  CO2 32 30 30  BUN 30* 34* 25*  CREATININE 1.38* 1.52* 0.97   Liver Panel  Recent Labs  02/05/14 1110 02/06/14 0720  PROT 6.0 6.2  ALBUMIN 2.1* 2.7*  AST 39* 34  ALT 33 26  ALKPHOS 90 97  BILITOT 1.3* 2.1*   Sedimentation Rate No results found for this basename: ESRSEDRATE,  in the last 72 hours C-Reactive Protein No results found for this basename: CRP,  in the last 72 hours  Microbiology: Recent Results (from the past 240 hour(s))  CULTURE, RESPIRATORY (NON-EXPECTORATED)     Status: None   Collection Time    02/02/14 11:49 AM      Result Value Ref Range Status   Specimen Description TRACHEAL ASPIRATE   Final   Special Requests NONE   Final   Gram Stain     Final   Value: FEW WBC PRESENT,BOTH PMN AND MONONUCLEAR     RARE SQUAMOUS EPITHELIAL CELLS PRESENT     RARE GRAM POSITIVE COCCI     IN PAIRS IN CLUSTERS     Performed at [facility]   Culture     Final   Value: MODERATE PSEUDOMONAS PUTIDA     Performed at  [facility]   Report Status 02/05/2014 FINAL   Final   Organism ID, Bacteria PSEUDOMONAS PUTIDA   Final  URINE CULTURE     Status: None   Collection Time    02/02/14 11:50 AM      Result Value Ref Range Status   Specimen Description URINE, RANDOM   Final   Special Requests NONE   Final   Culture  Setup Time     Final   Value: 02/02/2014 13:10     Performed at [place]     Final   Value: NO GROWTH     Performed at [facility]   Culture     Final   Value: NO GROWTH     Performed at [facility]   Report Status 02/03/2014 FINAL   Final  WOUND CULTURE     Status: None   Collection Time    02/02/14  1:54 PM      Result Value Ref Range Status   Specimen Description WOUND TRACHEAL SITE   Final   Special Requests Normal   Final  Gram Stain     Final   Value: RARE WBC PRESENT, PREDOMINANTLY MONONUCLEAR     NO SQUAMOUS EPITHELIAL CELLS SEEN     NO ORGANISMS SEEN     Performed at [facility]   Culture     Final   Value: MULTIPLE ORGANISMS PRESENT, NONE PREDOMINANT     Note: NO STAPHYLOCOCCUS AUREUS ISOLATED NO GROUP A STREP (S.PYOGENES) ISOLATED     Performed at [facility]   Report Status 02/04/2014 FINAL   Final  CULTURE, BLOOD (ROUTINE X 2)     Status: None   Collection Time    02/02/14  2:10 PM      Result Value Ref Range Status   Specimen Description BLOOD PICC LINE   Final   Special Requests BOTTLES DRAWN AEROBIC AND ANAEROBIC 10CC   Final   Culture  Setup Time     Final   Value: 02/02/2014 19:53     Performed at [facility]   Culture     Final   Value:        BLOOD CULTURE RECEIVED NO GROWTH TO DATE CULTURE WILL BE HELD FOR 5 DAYS BEFORE ISSUING A FINAL NEGATIVE REPORT     Performed at [facility]   Report Status PENDING   Incomplete    Studies/Results: Dg Chest Port 1 View  02/05/2014   CLINICAL DATA:  Hypoxia  EXAM: PORTABLE CHEST - 1 VIEW  COMPARISON:  02/05/2014  FINDINGS:  Cardiac shadow is stable. Postsurgical changes are again seen. Tracheostomy tube is noted in satisfactory position. A right-sided PICC line is again noted and stable. Mild atelectatic changes are noted along the minor fissure on the right. Stable atelectatic changes are noted in the left base.  IMPRESSION: Slight increase in atelectatic changes in the right upper lobe along the minor fissure. The remainder of the exam is stable.   Electronically Signed   By: Inez Catalina M.D.   On: 02/05/2014 12:50   Dg Chest Port 1 View  02/05/2014   CLINICAL DATA:  Respiratory distress. History of COPD with lung cancer.  EXAM: PORTABLE CHEST - 1 VIEW  COMPARISON:  02/02/2014.  FINDINGS: Normal cardiomediastinal silhouette. Chronic changes of COPD. PICC line tip cavoatrial junction. Stable LEFT suprahilar surgical clips. Mild LEFT subsegmental atelectasis, roughly stable. Mild vascular congestion without areas of focal consolidation. Stable blunting LEFT CP angle. No pneumothorax. Tracheostomy good position.  IMPRESSION: Stable chest.   Electronically Signed   By: Rolla Flatten M.D.   On: 02/05/2014 08:03   Dg Abd Portable 1v  02/05/2014   CLINICAL DATA:  Abdominal discomfort, ileus  EXAM: PORTABLE ABDOMEN - 1 VIEW  COMPARISON:  Portable abdominal films of September 26 and 27, 2015.  FINDINGS: This single portable film excludes the mid pelvis and below and portions of the right aspect of the abdomen. There remain loops of mildly distended gas-filled small bowel. There is a small amount of colonic gas. The gas -gastrostomy tube bulb is again demonstrated. There are surgical clips in the gallbladder fossa. There is atelectasis and likely a small amount of pleural fluid at the left lung base.  IMPRESSION: Persistent gaseous distention of small-bowel loops consistent with ileus or partial distal obstruction. No free extraluminal collections are demonstrated.   Electronically Signed   By: David  Martinique   On: 02/05/2014 08:02     Medications: I have reviewed the patient's current medications.  Assessment/Plan: VDRF/Hypoxia/AECOPD with ongoing tobacco abuse disorder: Invasive Squamous  Cell Carcinoma of Left Tonsil s/p Tonsillectomy/h/o Lung Cancer s/p Lobectomy: Septic Shock, PTA: -Continue vent weaning protocol tolerated 2 1/2 half hours; wean PEEP/FIO2 for sats > 92%; Venous Duplex today given ongoing hypoxia, LE edema, pain and erythema  -continue Performist and Duonebs with prn BD -pulm following -follow up with ENT / ONC regarding tonsillar CA  Post d/c from LTAC, d/w sister Juliann Pulse) and mother that pt will not be receiving radiation tx while in LTAC; will have ENT consult once VDRF is resolved NOTE: pulm recommended NOT to decannulate even if liberated from mechanical ventilation until input received from ENT, family aware and has pt ENT's name or we can consider one to coordinate care from host hospital.  Acute Ileus/PEM/Dysphagia: -resolved (see KUB) and resume TF per PEG and wean TPN to off, per RD/Pharm   Toxic EncephalopathyAlcoholism/Chronic Alcohol Abuse with possible Delirium Tremens:  -checked ammonia today...if elevated will get CT of Abd/Pelvis given acute ileus, possible cirrhosis and protuburent abdomen and initiate rifaximin and lactulose therapy. -alcohol abuse contributor of toxic encephalopathy, continue supportive care per tx plan and thiamine and folate daily supplementation; prn ativan and haldol ordered for increased anxiety and/or psychosis. Start Librium 25mg  PO QID and stop scheduled haldol and ativan. -contributor of hypophos/mag/kalemia, normalize electrolytes so unfortunately until stabilized daily CMP, mg, phos recommended. -monitor ongoing tolerance of statin that was likely started post AMI.  Hypomagnesium: -mag sulfate 2gm IV x 1 yesterday and cont daily mag oxide 400mg  BID; goal 2.0 given CAD  Hypophosphotemia: -given difficulty to wean from vent with ongoing hypoxia, pt  started on Phos-Nak 1pkt QID yesterday evening in hopes of maintaining normal level vs emergent KPhos infusions, continue telemetry for now.  Hypokalemia: -replete if needed, goal 4.0, recheck daily for now until proved stabilization and reduce the risk of hyperkalemia with scheduled potassium chloride order (KCl 34meq daily), titrate according to lasix use  Hyperglycemia: -Accuchecks q 6h, SSI low dose, hbgA1c pending -Levemir 5Units SQ daily being tolerated and can be titrated to goals  Alcoholic Hepatitis/Hepatis C hx: -resolving, acute hepatitis panel and GGT results pending. Discussed with family quantity of drinking.  Family stated that pt consumed large amounts of alcohol prior to going for procedure (recent hospitalization per admission H and p) which would explain resolving alcoholic hepatitis.  Also he drinks at least a 12-24 cans of beer weekly per his mother because she recently just bought 4-12 packs within the last 3 weeks; as pt is a self-proclaimed recluse x 2 years and distrustful of the medical community (as they forced him to go to doctor that prompted most recent hospitalization) which I explained unfortunately makes it difficult to refrain from ongoing alcohol and tobacco abuse.   Rhabdomyolysis: -noted and treated PTA, recheck CPK in am esp given statin use and non-specific pain  Anasarca/Hypoalbuminemia: -lymphedema consult today -d/c scheduled lasix (40mg  IV BID) given newly developed pre-renal azotemia; consider 60mg  or 80mg  IV qam if restart needed given refractory edema -trial of Albumin 25g x 3 doses (with first dose to be given concurrent with Lasix 60mg  IV x 1) given its oncotic properties in hopes of eliminated excessive accumulate volume  in this severely critically ill patient with severe hypoalbuminemia and peripheral edema.  CAD s/p PTCA/HLP: -continue cardiac supportive medications (BB, ASA, ACE-I, Diuretic) and titrate to goal and tolerance; benefits vs risk  of continuing Brilinta daily as ordered per cardiology at OSH.   HTN, uncontrolled: improved with med titration -now normotensive; titrate anti-hypertensive agents  to goal and tolerance using caution with increase of beta-blocker given risk of ppt bradycardia, observe for clinical effect  Anemia of chronic disease, microcytic/PUD: -H/H stable w/o s/s of GI bleed, cont ferrous sulfate + vitamin C BID, recheck in 3 months per PCP -checked iron panel, B12  Chronic Back Pain with Acute Pain Crisis likely post CODE BLUE: -change Fentanyl patch from 22mcg to 68mcg q 72h x 3 days then off, then start oxycodone 5mg  IR PO qam. Informed family that pt can not take NSAIDS (he was taking Aleve and Ibuprofen PTA for chronic back pain) given AMI/CAD and PUD. Therefore, he will likely require daily opiate therapy post d/c for back pain, will defer to PCP/Pain mgmt for definitive outpt treatment plan.. Also would consider bone mets given cancer dx and would recommend PET scan as outpt if not recently done, defer to ONC  MDD/Mood Disorder: -can titrate upward Effexor dose as needed, for now continue 75mg  BID and would recommend outpt psych consult as pt would benefit from counseling and psych medication mgmt.  Also d/w family that he planned on resuming his reclusive lifestyle and distrust to the medical community (to the point he would pick and choose which of his healthcare providers he chose to f/u with) could result in increased morbidity and event a terminal mortality event given severe occlusive CAD resulting in PTCA placement and need to continue anti-platelet therapy as instruction. Therefore, he should not resume alcohol and tobacco abuse.  Tobacco Abuse Disorder, continuous: -ordered Nicotine Patch 21mg  daily as may assist with increased anxiety and orders placed for smoking cessation counseling when pt is able to comprehend  Hypothyroidism: -continue levothyroxine 34mcg daily; recommend recheck TSH in 4  weeks and adjust dose accordingly; may warrant further workup as this is a new diagnosis for pt  Vitamin D Deficiency: -cont supplementation and recheck level in 4 weeks and adjust dose accordingly  Generalized Weakness: -PT/OT eval and tx when able  GI proph/Gastritis/PUD: Protonix 40mg  IV BID/probiotic VTE proph:   Heparin 5000U SQ BID given high bleeding risk with anti-platelet medication  Family Communication:/9/28 by doctor Toler : 2 Sisters ([place] and [place]) that live in [place], [place] and Mother (living w/daughter now while pt is hospitalized as mother lives in [place] with pt (prefers to be called Butch):  I informed Juliann Pulse that they need to provide evidence of MPOA/Advanced Directed that Juliann Pulse told me her sister Jackelyn Poling has???  Deferred them to our case mgmt team for completion of advanced directive.   NOTE: Patient HAS NOT developed an advanced directive with an attorney.     Merton Border 02/06/2014, 4:28 PM

## 2014-02-07 LAB — COMPREHENSIVE METABOLIC PANEL
ALK PHOS: 113 U/L (ref 39–117)
ALT: 37 U/L (ref 0–53)
ANION GAP: 10 (ref 5–15)
AST: 57 U/L — ABNORMAL HIGH (ref 0–37)
Albumin: 2.1 g/dL — ABNORMAL LOW (ref 3.5–5.2)
BILIRUBIN TOTAL: 1.8 mg/dL — AB (ref 0.3–1.2)
BUN: 25 mg/dL — AB (ref 6–23)
CHLORIDE: 97 meq/L (ref 96–112)
CO2: 30 mEq/L (ref 19–32)
Calcium: 7.5 mg/dL — ABNORMAL LOW (ref 8.4–10.5)
Creatinine, Ser: 1.07 mg/dL (ref 0.50–1.35)
GFR, EST AFRICAN AMERICAN: 87 mL/min — AB (ref >90)
GFR, EST NON AFRICAN AMERICAN: 75 mL/min — AB (ref >90)
Glucose, Bld: 130 mg/dL — ABNORMAL HIGH (ref 70–99)
POTASSIUM: 3.7 meq/L (ref 3.7–5.3)
Sodium: 137 mEq/L (ref 137–147)
Total Protein: 6 g/dL (ref 6.0–8.3)

## 2014-02-07 LAB — CBC WITH DIFFERENTIAL/PLATELET
Basophils Absolute: 0 10*3/uL (ref 0.0–0.1)
Basophils Relative: 0 % (ref 0–1)
Eosinophils Absolute: 0.1 10*3/uL (ref 0.0–0.7)
Eosinophils Relative: 1 % (ref 0–5)
HEMATOCRIT: 28 % — AB (ref 39.0–52.0)
HEMOGLOBIN: 8.9 g/dL — AB (ref 13.0–17.0)
LYMPHS ABS: 0.5 10*3/uL — AB (ref 0.7–4.0)
Lymphocytes Relative: 5 % — ABNORMAL LOW (ref 12–46)
MCH: 28.2 pg (ref 26.0–34.0)
MCHC: 31.8 g/dL (ref 30.0–36.0)
MCV: 88.6 fL (ref 78.0–100.0)
Monocytes Absolute: 0.9 10*3/uL (ref 0.1–1.0)
Monocytes Relative: 8 % (ref 3–12)
NEUTROS PCT: 86 % — AB (ref 43–77)
Neutro Abs: 9.2 10*3/uL — ABNORMAL HIGH (ref 1.7–7.7)
Platelets: 285 10*3/uL (ref 150–400)
RBC: 3.16 MIL/uL — ABNORMAL LOW (ref 4.22–5.81)
RDW: 17.7 % — ABNORMAL HIGH (ref 11.5–15.5)
WBC: 10.7 10*3/uL — ABNORMAL HIGH (ref 4.0–10.5)

## 2014-02-07 LAB — PHOSPHORUS: Phosphorus: 3 mg/dL (ref 2.3–4.6)

## 2014-02-07 LAB — MAGNESIUM: Magnesium: 2 mg/dL (ref 1.5–2.5)

## 2014-02-07 NOTE — Progress Notes (Signed)
Subjective: Pt unable to participate in ROS 2/2 acute encephalopathy and VDRF.  Objective: Vital signs in last 24 hours:   T              98 BP:  110/70 HR:  70 RR:  20 Pulse Ox: 100% on Vent I/O:  -780  Physical Exam: General: Ill appearing, confused HEENT: PERRLA EOMI bilaterally. Sclera anicteric bilaterally. MMM NECK: Supple, No JVD or LAD, + Trach midline and secure. #8 shiley Lungs: Symmetrical chest rise. Scatter wheezes and rhonchi CV: Normal S1, S2. No m/c/r Abdomen: Protuberant, however S/NT/ND +BS X 4. No rebound, guarding. +PEG secure and C/D/I EXT: No cyanosis or clubbing, only trace edema in extremities U>L and scrotum Skin: Warm/dry and intact Neuro: grossly intact and SMAEs, unable to perform formal CN exam 2/2 acute encephalopathy   Lab Results  Recent Labs  02/06/14 0720 02/07/14 1100  WBC 7.4 10.7*  HGB 9.6* 8.9*  HCT 30.3* 28.0*  NA 137 137  K 3.3* 3.7  CL 96 97  CO2 30 30  BUN 25* 25*  CREATININE 0.97 1.07   Liver Panel  Recent Labs  02/06/14 0720 02/07/14 1100  PROT 6.2 6.0  ALBUMIN 2.7* 2.1*  AST 34 57*  ALT 26 37  ALKPHOS 97 113  BILITOT 2.1* 1.8*   Sedimentation Rate No results found for this basename: ESRSEDRATE,  in the last 72 hours C-Reactive Protein No results found for this basename: CRP,  in the last 72 hours  Microbiology: Recent Results (from the past 240 hour(s))  CULTURE, RESPIRATORY (NON-EXPECTORATED)     Status: None   Collection Time    02/02/14 11:49 AM      Result Value Ref Range Status   Specimen Description TRACHEAL ASPIRATE   Final   Special Requests NONE   Final   Gram Stain     Final   Value: FEW WBC PRESENT,BOTH PMN AND MONONUCLEAR     RARE SQUAMOUS EPITHELIAL CELLS PRESENT     RARE GRAM POSITIVE COCCI     IN PAIRS IN CLUSTERS     Performed at [facility]   Culture     Final   Value: MODERATE PSEUDOMONAS PUTIDA     Performed at [facility]   Report Status 02/05/2014 FINAL    Final   Organism ID, Bacteria PSEUDOMONAS PUTIDA   Final  URINE CULTURE     Status: None   Collection Time    02/02/14 11:50 AM      Result Value Ref Range Status   Specimen Description URINE, RANDOM   Final   Special Requests NONE   Final   Culture  Setup Time     Final   Value: 02/02/2014 13:10     Performed at [place]     Final   Value: NO GROWTH     Performed at [facility]   Culture     Final   Value: NO GROWTH     Performed at [facility]   Report Status 02/03/2014 FINAL   Final  WOUND CULTURE     Status: None   Collection Time    02/02/14  1:54 PM      Result Value Ref Range Status   Specimen Description WOUND TRACHEAL SITE   Final   Special Requests Normal   Final   Gram Stain     Final   Value: RARE WBC PRESENT, PREDOMINANTLY MONONUCLEAR  NO SQUAMOUS EPITHELIAL CELLS SEEN     NO ORGANISMS SEEN     Performed at [facility]   Culture     Final   Value: MULTIPLE ORGANISMS PRESENT, NONE PREDOMINANT     Note: NO STAPHYLOCOCCUS AUREUS ISOLATED NO GROUP A STREP (S.PYOGENES) ISOLATED     Performed at [facility]   Report Status 02/04/2014 FINAL   Final  CULTURE, BLOOD (ROUTINE X 2)     Status: None   Collection Time    02/02/14  2:10 PM      Result Value Ref Range Status   Specimen Description BLOOD PICC LINE   Final   Special Requests BOTTLES DRAWN AEROBIC AND ANAEROBIC 10CC   Final   Culture  Setup Time     Final   Value: 02/02/2014 19:53     Performed at [facility]   Culture     Final   Value:        BLOOD CULTURE RECEIVED NO GROWTH TO DATE CULTURE WILL BE HELD FOR 5 DAYS BEFORE ISSUING A FINAL NEGATIVE REPORT     Performed at [facility]   Report Status PENDING   Incomplete    Studies/Results: No results found.  Medications: I have reviewed the patient's current medications.  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 Start Librium 25mg  PO QID and stop scheduled haldol and ativan. -contributor of hypophos/mag/kalemia, normalize electrolytes so unfortunately until stabilized daily CMP, mg, phos recommended. -monitor ongoing tolerance of statin that was likely started post AMI.  Hypomagnesium: -mag sulfate 2gm IV x 1 yesterday and cont daily mag oxide 400mg  BID; goal 2.0 given CAD  Hypophosphotemia: -given difficulty to wean from vent with ongoing hypoxia, pt started on Phos-Nak 1pkt QID yesterday evening in hopes of maintaining normal level vs emergent KPhos infusions, continue telemetry for now.  Hypokalemia: -replete if needed, goal 4.0, recheck daily for now until proved stabilization and reduce the risk of hyperkalemia with scheduled potassium chloride order (KCl 34meq daily), titrate according to lasix use  Hyperglycemia: -Accuchecks q 6h, SSI low dose, hbgA1c pending -Levemir 5Units SQ daily being  tolerated and can be titrated to goals  Alcoholic Hepatitis/Hepatis C hx: -resolving, acute hepatitis panel and GGT results pending. Discussed with family quantity of drinking.  Family stated that pt consumed large amounts of alcohol prior to going for procedure (recent hospitalization per admission H and p) which would explain resolving alcoholic hepatitis.  Also he drinks at least a 12-24 cans of  beer weekly per his mother because she recently just bought 4-12 packs within the last 3 weeks; as pt is a self-proclaimed recluse x 2 years and distrustful of the medical community (as they forced him to go to doctor that prompted most recent hospitalization) which I explained unfortunately makes it difficult to refrain from ongoing alcohol and tobacco abuse.   Rhabdomyolysis: -noted and treated PTA, recheck CPK in am esp given statin use and non-specific pain  Anasarca/Hypoalbuminemia: -lymphedema consult today -d/c scheduled lasix (40mg  IV BID) given newly developed pre-renal azotemia; consider 60mg  or 80mg  IV qam if restart needed given refractory edema -trial of Albumin 25g x 3 doses (with first dose to be given concurrent with Lasix 60mg  IV x 1) given its oncotic properties in hopes of eliminated excessive accumulate volume  in this severely critically ill patient with severe hypoalbuminemia and peripheral edema.  CAD s/p PTCA/HLP: -continue cardiac supportive medications (BB, ASA, ACE-I, Diuretic) and titrate to goal and tolerance; benefits vs risk of continuing Brilinta daily as ordered per cardiology at OSH.   HTN, uncontrolled: improved with med titration -now normotensive; titrate anti-hypertensive agents to goal and tolerance using caution with increase of beta-blocker given risk of ppt bradycardia, observe for clinical effect  Anemia of chronic disease, microcytic/PUD: -H/H stable w/o s/s of GI bleed, cont ferrous sulfate + vitamin C BID, recheck in 3 months per PCP -checked iron panel,  B12  Chronic Back Pain with Acute Pain Crisis likely post CODE BLUE: -change Fentanyl patch from 31mcg to 25mcg q 72h x 3 days then off, then start oxycodone 5mg  IR PO qam. Informed family that pt can not take NSAIDS (he was taking Aleve and Ibuprofen PTA for chronic back pain) given AMI/CAD and PUD. Therefore, he will likely require daily opiate therapy post d/c for back pain, will defer to PCP/Pain mgmt for definitive outpt treatment plan.. Also would consider bone mets given cancer dx and would recommend PET scan as outpt if not recently done, defer to ONC  MDD/Mood Disorder: - Effexor  75mg  BID and would recommend outpt psych consult as pt would benefit from counseling and psych medication mgmt.  Also d/w family that he planned on resuming his reclusive lifestyle and distrust to the medical community (to the point he would pick and choose which of his healthcare providers he chose to f/u with) could result in increased morbidity and event a terminal mortality event given severe occlusive CAD resulting in PTCA placement and need to continue anti-platelet therapy as instruction. Therefore, he should not resume alcohol and tobacco abuse.  Tobacco Abuse Disorder, continuous: -ordered Nicotine Patch 21mg  daily   Hypothyroidism: -continue levothyroxine 64mcg daily; recommend recheck TSH in 4 weeks and adjust dose accordingly; may warrant further workup as this is a new diagnosis for pt  Vitamin D Deficiency: -cont supplementation and recheck level in 4 weeks and adjust dose accordingly  Generalized Weakness: -PT/OT eval and tx when able  GI proph/Gastritis/PUD: Protonix 40mg  IV BID/probiotic VTE proph:   Heparin 5000U SQ BID given high bleeding risk with anti-platelet medication  Family Communication:/9/28 by doctor Toler : 2 Sisters ([place] and [place]) that live in [place], [place] and Mother (living w/daughter now while pt is hospitalized as mother lives in [place] with pt (prefers to be called  Butch):  I informed Juliann Pulse that they need to provide evidence of MPOA/Advanced Directed that Juliann Pulse told me her sister Jackelyn Poling has???  Deferred them to our case mgmt team for completion of advanced directive.  NOTE: Patient HAS NOT developed an advanced directive with an attorney.     Merton Border 02/07/2014, 9:01 PM

## 2014-02-08 DIAGNOSIS — J9621 Acute and chronic respiratory failure with hypoxia: Secondary | ICD-10-CM

## 2014-02-08 DIAGNOSIS — Z930 Tracheostomy status: Secondary | ICD-10-CM

## 2014-02-08 LAB — CULTURE, BLOOD (ROUTINE X 2): CULTURE: NO GROWTH

## 2014-02-08 LAB — PHOSPHORUS: Phosphorus: 2.6 mg/dL (ref 2.3–4.6)

## 2014-02-08 NOTE — Progress Notes (Signed)
Name: Bradley Doyle MRN: [id number] DOB: Sep 01, 1955    ADMISSION DATE:  01/15/2014 CONSULTATION DATE:  01/22/2014  REFERRING MD :  Dr. Laren Everts PRIMARY SERVICE:  [facility]   CHIEF COMPLAINT:  Respiratory Failure, Tracheostomy status, Ventilator Dependence   BRIEF PATIENT DESCRIPTION: 58 y/o M admitted to [facility] from [facility] after prolonged hospitalization related to STEMI + Cardiac Arrest after a tonsillectomy (L) due to invasive tonsillar cancer.    SIGNIFICANT EVENTS / STUDIES:  9/23  Admit to [facility] from [facility] post STEMI & cardiac arrest after a tonsillectomy for invasive tonsillar CA 9/28 pressure limits vent on PRVC 10/1 on flow of 70 l/m  LINES / TUBES: # 8 Trach >> Peg>>   CULTURES: Cultures to date from [facility] >> negative (blood, urine, sputum)  ANTIBIOTICS: Per [facility] MD  SUBJECTIVE: Full vent support  VITAL SIGNS: Vital signs reviewed. Abnormal values will appear under impression plan section.    PHYSICAL EXAMINATION: General:  Chronically ill in NAD Neuro:  Awake, alert, nods appropriately, MAE, generalized weakness  HEENT:  Mm pink/moist, no JVD, trach site with purulent drainage and erosion Cardiovascular:  s1s2 rrr, no m/r/g Lungs:  resp's even/non-labored, lungs bilaterally with scattered wheezing  Abdomen:  Distended, bsx4 active, PEG site clean Musculoskeletal:  No acute deformities  Skin:  Warm/dry, no edema   Recent Labs Lab 02/05/14 1110 02/06/14 0720 02/07/14 1100  NA 136* 137 137  K 3.7 3.3* 3.7  CL 94* 96 97  CO2 30 30 30   BUN 34* 25* 25*  CREATININE 1.52* 0.97 1.07  GLUCOSE 128* 104* 130*    Recent Labs Lab 02/05/14 0627 02/06/14 0720 02/07/14 1100  HGB 10.0* 9.6* 8.9*  HCT 32.2* 30.3* 28.0*  WBC 7.3 7.4 10.7*  PLT 289 274 285   No results found.  ASSESSMENT / PLAN:  58 y/o M admitted to [facility] after admission to [facility] for tonsillectomy in the setting of invasive squamous cell carcinoma.  Hx of lung  cancer s/p lobectomy (unknown type).  Course complicated by NSTEMI & cardiac arrest in PACU.  S/P PCTA to LAD.  Trach / PEG on 9/18.  Tx to [facility] on 9/23.     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 Timing & type)  Plan: Continue with weaning as tolerated but not weaning today Wean PEEP/FiO2 for sats > 92% F/u CXR   Duonebs Q6 with PRN albuterol Q3 Mobilize as able Will need to follow up with ENT / ONC regarding tonsillar CA and ulcer with bile drainage from stoma Would not decannulate even if liberated from mechanical ventilation until input received from ENT  NSTEMI CAD s/p Stent to LAD HTN  Plan: Per primary  Hx GIB  Ileus  Plan: Monitor for bleeding TNA per [facility] Consider GI consult due to bile drainage around trach site. May need NGT + Peg to suction.  Discussion: Should EOL discussions be entertained?   Richardson Landry Minor ACNP Maryanna Shape PCCM Pager [phone] till 3 pm If no answer page [phone]   PCCM ATTENDING: I have interviewed and examined the patient and reviewed the database. I have formulated the assessment and plan as reflected in the note above with amendments made by me.   Discussed with Dr Delton See, MD;  PCCM service; Mobile [phone]  02/08/2014, 11:36 AM

## 2014-02-08 NOTE — Progress Notes (Signed)
Subjective: Pt unable to participate in ROS 2/2 acute encephalopathy and VDRF.  Objective: Vital signs in last 24 hours:   T             97.3 BP:  123/73 HR:  88 RR:  19 Pulse Ox: 100% on Vent I/O:  +640  Physical Exam:  General: Ill appearing, confused HEENT: PERRLA EOMI bilaterally. Sclera anicteric bilaterally. MMM NECK: Supple, No JVD or LAD, + Trach midline and secure. #8 shiley Lungs: Symmetrical chest rise. Scatter wheezes and rhonchi CV: Normal S1, S2. No m/c/r Abdomen: Protuberant, however S/NT/ND +BS X 4. No rebound, guarding. +PEG secure and C/D/I EXT: No cyanosis or clubbing, only trace edema in extremities U>L and scrotum Skin: Warm/dry and intact Neuro: grossly intact and SMAEs, unable to perform formal CN exam 2/2 acute encephalopathy   Lab Results  Recent Labs  02/06/14 0720 02/07/14 1100  WBC 7.4 10.7*  HGB 9.6* 8.9*  HCT 30.3* 28.0*  NA 137 137  K 3.3* 3.7  CL 96 97  CO2 30 30  BUN 25* 25*  CREATININE 0.97 1.07   Liver Panel  Recent Labs  02/06/14 0720 02/07/14 1100  PROT 6.2 6.0  ALBUMIN 2.7* 2.1*  AST 34 57*  ALT 26 37  ALKPHOS 97 113  BILITOT 2.1* 1.8*   Sedimentation Rate No results found for this basename: ESRSEDRATE,  in the last 72 hours C-Reactive Protein No results found for this basename: CRP,  in the last 72 hours  Microbiology: Recent Results (from the past 240 hour(s))  CULTURE, RESPIRATORY (NON-EXPECTORATED)     Status: None   Collection Time    02/02/14 11:49 AM      Result Value Ref Range Status   Specimen Description TRACHEAL ASPIRATE   Final   Special Requests NONE   Final   Gram Stain     Final   Value: FEW WBC PRESENT,BOTH PMN AND MONONUCLEAR     RARE SQUAMOUS EPITHELIAL CELLS PRESENT     RARE GRAM POSITIVE COCCI     IN PAIRS IN CLUSTERS     Performed at [facility]   Culture     Final   Value: MODERATE PSEUDOMONAS PUTIDA     Performed at [facility]   Report Status 02/05/2014 FINAL    Final   Organism ID, Bacteria PSEUDOMONAS PUTIDA   Final  URINE CULTURE     Status: None   Collection Time    02/02/14 11:50 AM      Result Value Ref Range Status   Specimen Description URINE, RANDOM   Final   Special Requests NONE   Final   Culture  Setup Time     Final   Value: 02/02/2014 13:10     Performed at [facility]     Final   Value: NO GROWTH     Performed at [facility]   Culture     Final   Value: NO GROWTH     Performed at [facility]   Report Status 02/03/2014 FINAL   Final  WOUND CULTURE     Status: None   Collection Time    02/02/14  1:54 PM      Result Value Ref Range Status   Specimen Description WOUND TRACHEAL SITE   Final   Special Requests Normal   Final   Gram Stain     Final   Value: RARE WBC PRESENT, PREDOMINANTLY MONONUCLEAR  NO SQUAMOUS EPITHELIAL CELLS SEEN     NO ORGANISMS SEEN     Performed at [facility]   Culture     Final   Value: MULTIPLE ORGANISMS PRESENT, NONE PREDOMINANT     Note: NO STAPHYLOCOCCUS AUREUS ISOLATED NO GROUP A STREP (S.PYOGENES) ISOLATED     Performed at [facility]   Report Status 02/04/2014 FINAL   Final  CULTURE, BLOOD (ROUTINE X 2)     Status: None   Collection Time    02/02/14  2:10 PM      Result Value Ref Range Status   Specimen Description BLOOD PICC LINE   Final   Special Requests BOTTLES DRAWN AEROBIC AND ANAEROBIC 10CC   Final   Culture  Setup Time     Final   Value: 02/02/2014 19:53     Performed at [facility]   Culture     Final   Value: NO GROWTH 5 DAYS     Performed at [facility]   Report Status 02/08/2014 FINAL   Final    Studies/Results: No results found.  Medications: I have reviewed the patient's current medications.  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 Start Librium 25mg  PO QID and stop scheduled haldol and ativan. -contributor of hypophos/mag/kalemia, normalize electrolytes so unfortunately until stabilized daily CMP, mg, phos recommended. -monitor ongoing tolerance of statin that was likely started post AMI.  Hypomagnesium: -mag sulfate 2gm IV x 1 yesterday and cont daily mag oxide 400mg  BID; goal 2.0 given CAD  Hypophosphotemia: -given difficulty to wean from vent with ongoing hypoxia, pt started on Phos-Nak 1pkt QID yesterday evening in hopes of maintaining normal level vs emergent KPhos infusions, continue telemetry for now.  Hypokalemia: -replete if needed, goal 4.0, recheck daily for now until proved stabilization and reduce the risk of hyperkalemia with scheduled potassium chloride order (KCl 29meq daily), titrate according to lasix use  Hyperglycemia: -Accuchecks q 6h, SSI low dose, hbgA1c pending -Levemir 5Units SQ daily being tolerated and can be titrated to goals  Alcoholic Hepatitis/Hepatis C hx: -resolving, acute hepatitis  panel and GGT results pending. Discussed with family quantity of drinking.  Family stated that pt consumed large amounts of alcohol prior to going for procedure (recent hospitalization per admission H and p) which would explain resolving alcoholic hepatitis.  Also he drinks at least a 12-24 cans of beer weekly per his mother because she recently just bought 4-12 packs within the last 3 weeks; as pt is a self-proclaimed  recluse x 2 years and distrustful of the medical community (as they forced him to go to doctor that prompted most recent hospitalization) which I explained unfortunately makes it difficult to refrain from ongoing alcohol and tobacco abuse.   Rhabdomyolysis: -noted and treated PTA, recheck CPK in am esp given statin use and non-specific pain  Anasarca/Hypoalbuminemia: -lymphedema consult today -d/c scheduled lasix (40mg  IV BID) given newly developed pre-renal azotemia; consider 60mg  or 80mg  IV qam if restart needed given refractory edema -trial of Albumin 25g x 3 doses (with first dose to be given concurrent with Lasix 60mg  IV x 1) given its oncotic properties in hopes of eliminated excessive accumulate volume  in this severely critically ill patient with severe hypoalbuminemia and peripheral edema.  CAD s/p PTCA/HLP: -continue cardiac supportive medications (BB, ASA, ACE-I, Diuretic) and titrate to goal and tolerance; benefits vs risk of continuing Brilinta daily as ordered per cardiology at OSH.   HTN, uncontrolled: improved with med titration -now normotensive; titrate anti-hypertensive agents to goal and tolerance using caution with increase of beta-blocker given risk of ppt bradycardia, observe for clinical effect  Anemia of chronic disease, microcytic/PUD: -H/H stable w/o s/s of GI bleed, cont ferrous sulfate + vitamin C BID, recheck in 3 months per PCP -checked iron panel, B12  Chronic Back Pain with Acute Pain Crisis likely post CODE BLUE: -change Fentanyl patch from 16mcg  to 71mcg q 72h x 3 days then off, then start oxycodone 5mg  IR PO qam. Informed family that pt can not take NSAIDS (he was taking Aleve and Ibuprofen PTA for chronic back pain) given AMI/CAD and PUD. Therefore, he will likely require daily opiate therapy post d/c for back pain, will defer to PCP/Pain mgmt for definitive outpt treatment plan.. Also would consider bone mets given cancer dx and would recommend PET scan as outpt if not recently done, defer to ONC  MDD/Mood Disorder: - Effexor  75mg  BID and would recommend outpt psych consult as pt would benefit from counseling and psych medication mgmt.  Also d/w family that he planned on resuming his reclusive lifestyle and distrust to the medical community (to the point he would pick and choose which of his healthcare providers he chose to f/u with) could result in increased morbidity and event a terminal mortality event given severe occlusive CAD resulting in PTCA placement and need to continue anti-platelet therapy as instruction. Therefore, he should not resume alcohol and tobacco abuse.  Tobacco Abuse Disorder, continuous: -ordered Nicotine Patch 21mg  daily   Hypothyroidism: -continue levothyroxine 10mcg daily; recommend recheck TSH in 4 weeks and adjust dose accordingly; may warrant further workup as this is a new diagnosis for pt  Vitamin D Deficiency: -cont supplementation and recheck level in 4 weeks and adjust dose accordingly  Generalized Weakness: -PT/OT eval and tx when able  GI proph/Gastritis/PUD: Protonix 40mg  IV BID/probiotic VTE proph:   Heparin 5000U SQ BID given high bleeding risk with anti-platelet medication  Family Communication:/9/28 by doctor Toler : 2 Sisters ([place] and [place]) that live in [place], [place] and Mother (living w/daughter now while pt is hospitalized as mother lives in [place] with pt (prefers to be called Butch):  I informed Juliann Pulse that they need to provide evidence of MPOA/Advanced Directed that Juliann Pulse told me  her sister Jackelyn Poling has???  Deferred them to our case mgmt team for completion of advanced directive.   NOTE: Patient HAS NOT developed an advanced directive with an attorney.     Merton Border 02/08/2014, 1:25 PM

## 2014-02-08 DEATH — deceased

## 2014-02-09 LAB — PHOSPHORUS: Phosphorus: 2.9 mg/dL (ref 2.3–4.6)

## 2014-02-09 NOTE — Progress Notes (Signed)
Subjective: Pt unable to participate in ROS 2/2 acute encephalopathy and VDRF.  Objective: Vital signs in last 24 hours:   T              99.5 BP:  104/64 HR:  74 RR:  22 Pulse Ox: 98 % on Vent I/O:  + 1070  Physical Exam:  General: Ill appearing, confused HEENT: PERRLA EOMI bilaterally. Sclera anicteric bilaterally. MMM NECK: Supple, No JVD or LAD, + Trach midline and secure. #8 shiley Lungs: Symmetrical chest rise. Scatter wheezes and rhonchi CV: Normal S1, S2. No m/c/r Abdomen: Protuberant, however S/NT/ND +BS X 4. No rebound, guarding. +PEG secure and C/D/I EXT: No cyanosis or clubbing, only trace edema in extremities U>L and scrotum Skin: Warm/dry and intact Neuro: grossly intact and SMAEs, unable to perform formal CN exam 2/2 acute encephalopathy   Lab Results  Recent Labs  02/07/14 1100  WBC 10.7*  HGB 8.9*  HCT 28.0*  NA 137  K 3.7  CL 97  CO2 30  BUN 25*  CREATININE 1.07   Liver Panel  Recent Labs  02/07/14 1100  PROT 6.0  ALBUMIN 2.1*  AST 57*  ALT 37  ALKPHOS 113  BILITOT 1.8*   Sedimentation Rate No results found for this basename: ESRSEDRATE,  in the last 72 hours C-Reactive Protein No results found for this basename: CRP,  in the last 72 hours  Microbiology: Recent Results (from the past 240 hour(s))  CULTURE, RESPIRATORY (NON-EXPECTORATED)     Status: None   Collection Time    02/02/14 11:49 AM      Result Value Ref Range Status   Specimen Description TRACHEAL ASPIRATE   Final   Special Requests NONE   Final   Gram Stain     Final   Value: FEW WBC PRESENT,BOTH PMN AND MONONUCLEAR     RARE SQUAMOUS EPITHELIAL CELLS PRESENT     RARE GRAM POSITIVE COCCI     IN PAIRS IN CLUSTERS     Performed at [facility]   Culture     Final   Value: MODERATE PSEUDOMONAS PUTIDA     Performed at [facility]   Report Status 02/05/2014 FINAL   Final   Organism ID, Bacteria PSEUDOMONAS PUTIDA   Final  URINE CULTURE     Status: None    Collection Time    02/02/14 11:50 AM      Result Value Ref Range Status   Specimen Description URINE, RANDOM   Final   Special Requests NONE   Final   Culture  Setup Time     Final   Value: 02/02/2014 13:10     Performed at [facility]     Final   Value: NO GROWTH     Performed at [facility]   Culture     Final   Value: NO GROWTH     Performed at [facility]   Report Status 02/03/2014 FINAL   Final  WOUND CULTURE     Status: None   Collection Time    02/02/14  1:54 PM      Result Value Ref Range Status   Specimen Description WOUND TRACHEAL SITE   Final   Special Requests Normal   Final   Gram Stain     Final   Value: RARE WBC PRESENT, PREDOMINANTLY MONONUCLEAR     NO SQUAMOUS EPITHELIAL CELLS SEEN     NO ORGANISMS SEEN  Performed at [facility]     Final   Value: MULTIPLE ORGANISMS PRESENT, NONE PREDOMINANT     Note: NO STAPHYLOCOCCUS AUREUS ISOLATED NO GROUP A STREP (S.PYOGENES) ISOLATED     Performed at [facility]   Report Status 02/04/2014 FINAL   Final  CULTURE, BLOOD (ROUTINE X 2)     Status: None   Collection Time    02/02/14  2:10 PM      Result Value Ref Range Status   Specimen Description BLOOD PICC LINE   Final   Special Requests BOTTLES DRAWN AEROBIC AND ANAEROBIC 10CC   Final   Culture  Setup Time     Final   Value: 02/02/2014 19:53     Performed at [facility]   Culture     Final   Value: NO GROWTH 5 DAYS     Performed at [facility]   Report Status 02/08/2014 FINAL   Final    Studies/Results: No results found.  Medications: I have reviewed the patient's current medications.  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 Start Librium 25mg  PO QID and stop scheduled haldol and ativan. -contributor of hypophos/mag/kalemia, normalize electrolytes so unfortunately until stabilized daily CMP, mg, phos recommended. -monitor ongoing tolerance of statin that was likely started post AMI.  Hypomagnesium: -mag sulfate 2gm IV x 1 yesterday and cont daily mag oxide 400mg  BID; goal 2.0 given CAD  Hypophosphotemia: -given difficulty to wean from vent with ongoing hypoxia, pt started on Phos-Nak 1pkt QID yesterday evening in hopes of maintaining normal level vs emergent KPhos infusions, continue telemetry for now.  Hypokalemia: -replete if needed, goal 4.0, recheck daily for now until proved stabilization and reduce the risk of hyperkalemia with scheduled potassium chloride order (KCl 20meq daily), titrate according to lasix use  Hyperglycemia: -Accuchecks q 6h, SSI low dose, hbgA1c pending -Levemir 5Units SQ daily being tolerated and can be titrated to goals  Alcoholic Hepatitis/Hepatis C hx: -resolving, acute hepatitis panel and GGT results pending.  Discussed with family quantity of drinking.  Family stated that pt consumed large amounts of alcohol prior to going for procedure (recent hospitalization per admission H and p) which would explain resolving alcoholic hepatitis.  Also he drinks at least a 12-24 cans of beer weekly per his mother because she recently just bought 4-12 packs within the last 3 weeks; as pt is a self-proclaimed recluse x 2 years and distrustful of the  medical community (as they forced him to go to doctor that prompted most recent hospitalization) which I explained unfortunately makes it difficult to refrain from ongoing alcohol and tobacco abuse.   Rhabdomyolysis: -noted and treated PTA, recheck CPK in am esp given statin use and non-specific pain  Anasarca/Hypoalbuminemia: -lymphedema consult today -d/c scheduled lasix (40mg  IV BID) given newly developed pre-renal azotemia; consider 60mg  or 80mg  IV qam if restart needed given refractory edema -trial of Albumin 25g x 3 doses (with first dose to be given concurrent with Lasix 60mg  IV x 1) given its oncotic properties in hopes of eliminated excessive accumulate volume  in this severely critically ill patient with severe hypoalbuminemia and peripheral edema.  CAD s/p PTCA/HLP: -continue cardiac supportive medications (BB, ASA, ACE-I, Diuretic) and titrate to goal and tolerance; benefits vs risk of continuing Brilinta daily as ordered per cardiology at OSH.   HTN, uncontrolled: improved with med titration -now normotensive; titrate anti-hypertensive agents to goal and tolerance using caution with increase of beta-blocker given risk of ppt bradycardia, observe for clinical effect  Anemia of chronic disease, microcytic/PUD: -H/H stable w/o s/s of GI bleed, cont ferrous sulfate + vitamin C BID, recheck in 3 months per PCP -checked iron panel, B12  Chronic Back Pain with Acute Pain Crisis likely post CODE BLUE: -change Fentanyl patch from 71mcg to 58mcg q 72h x 3 days then  off, then start oxycodone 5mg  IR PO qam. Informed family that pt can not take NSAIDS (he was taking Aleve and Ibuprofen PTA for chronic back pain) given AMI/CAD and PUD. Therefore, he will likely require daily opiate therapy post d/c for back pain, will defer to PCP/Pain mgmt for definitive outpt treatment plan.. Also would consider bone mets given cancer dx and would recommend PET scan as outpt if not recently done, defer to ONC  MDD/Mood Disorder: - Effexor  75mg  BID and would recommend outpt psych consult as pt would benefit from counseling and psych medication mgmt.  Also d/w family that he planned on resuming his reclusive lifestyle and distrust to the medical community (to the point he would pick and choose which of his healthcare providers he chose to f/u with) could result in increased morbidity and event a terminal mortality event given severe occlusive CAD resulting in PTCA placement and need to continue anti-platelet therapy as instruction. Therefore, he should not resume alcohol and tobacco abuse.  Tobacco Abuse Disorder, continuous: -ordered Nicotine Patch 21mg  daily   Hypothyroidism: -continue levothyroxine 27mcg daily; recommend recheck TSH in 4 weeks and adjust dose accordingly; may warrant further workup as this is a new diagnosis for pt  Vitamin D Deficiency: -cont supplementation and recheck level in 4 weeks and adjust dose accordingly  Generalized Weakness: -PT/OT eval and tx when able  GI proph/Gastritis/PUD: Protonix 40mg  IV BID/probiotic VTE proph:   Heparin 5000U SQ BID given high bleeding risk with anti-platelet medication  Family Communication:/9/28 by doctor Toler : 2 Sisters (Buffalo and Martin) that live in [place], [place] and Mother (living w/daughter now while pt is hospitalized as mother lives in [place] with pt (prefers to be called Butch):  I informed Juliann Pulse that they need to provide evidence of MPOA/Advanced Directed that Juliann Pulse told me her sister Jackelyn Poling has???   Deferred them to our case mgmt team for completion of advanced directive.   NOTE: Patient HAS NOT developed an advanced directive with an attorney.     Merton Border 02/09/2014, 1:53 PM

## 2014-02-10 ENCOUNTER — Other Ambulatory Visit (HOSPITAL_COMMUNITY): Payer: Self-pay

## 2014-02-10 LAB — BASIC METABOLIC PANEL
Anion gap: 8 (ref 5–15)
BUN: 28 mg/dL — AB (ref 6–23)
CO2: 27 mEq/L (ref 19–32)
Calcium: 7.8 mg/dL — ABNORMAL LOW (ref 8.4–10.5)
Chloride: 96 mEq/L (ref 96–112)
Creatinine, Ser: 0.86 mg/dL (ref 0.50–1.35)
GFR calc Af Amer: >90 mL/min (ref >90)
GLUCOSE: 150 mg/dL — AB (ref 70–99)
Potassium: 4.3 mEq/L (ref 3.7–5.3)
Sodium: 131 mEq/L — ABNORMAL LOW (ref 137–147)

## 2014-02-10 LAB — CBC
HEMATOCRIT: 27.3 % — AB (ref 39.0–52.0)
Hemoglobin: 8.6 g/dL — ABNORMAL LOW (ref 13.0–17.0)
MCH: 27.4 pg (ref 26.0–34.0)
MCHC: 31.5 g/dL (ref 30.0–36.0)
MCV: 86.9 fL (ref 78.0–100.0)
Platelets: 352 10*3/uL (ref 150–400)
RBC: 3.14 MIL/uL — ABNORMAL LOW (ref 4.22–5.81)
RDW: 18.1 % — ABNORMAL HIGH (ref 11.5–15.5)
WBC: 8.9 10*3/uL (ref 4.0–10.5)

## 2014-02-10 LAB — PHOSPHORUS: Phosphorus: 2.8 mg/dL (ref 2.3–4.6)

## 2014-02-10 NOTE — Progress Notes (Signed)
Subjective: Pt unable to participate in ROS 2/2 acute encephalopathy and VDRF.  Objective: Vital signs in last 24 hours:   T              99.6 BP:  110/82 HR:  67 RR:  27 Pulse Ox: 100 % on Vent I/O:  Unknown  Physical Exam:  General: Ill appearing, confused HEENT: PERRLA EOMI bilaterally. Sclera anicteric bilaterally. MMM NECK: Supple, No JVD or LAD, + Trach midline and secure. #8 shiley Lungs: Symmetrical chest rise. Scatter wheezes and rhonchi CV: Normal S1, S2. No m/c/r Abdomen: Protuberant, however S/NT/ND +BS X 4. No rebound, guarding. +PEG secure and C/D/I EXT: No cyanosis or clubbing, only trace edema in extremities U>L and scrotum Skin: Warm/dry and intact Neuro: grossly intact and SMAEs, unable to perform formal CN exam 2/2 acute encephalopathy   Lab Results  Recent Labs  02/10/14 0508  WBC 8.9  HGB 8.6*  HCT 27.3*  NA 131*  K 4.3  CL 96  CO2 27  BUN 28*  CREATININE 0.86   Liver Panel No results found for this basename: PROT, ALBUMIN, AST, ALT, ALKPHOS, BILITOT, BILIDIR, IBILI,  in the last 72 hours Sedimentation Rate No results found for this basename: ESRSEDRATE,  in the last 72 hours C-Reactive Protein No results found for this basename: CRP,  in the last 72 hours  Microbiology: Recent Results (from the past 240 hour(s))  CULTURE, RESPIRATORY (NON-EXPECTORATED)     Status: None   Collection Time    02/02/14 11:49 AM      Result Value Ref Range Status   Specimen Description TRACHEAL ASPIRATE   Final   Special Requests NONE   Final   Gram Stain     Final   Value: FEW WBC PRESENT,BOTH PMN AND MONONUCLEAR     RARE SQUAMOUS EPITHELIAL CELLS PRESENT     RARE GRAM POSITIVE COCCI     IN PAIRS IN CLUSTERS     Performed at [facility]   Culture     Final   Value: MODERATE PSEUDOMONAS PUTIDA     Performed at [facility]   Report Status 02/05/2014 FINAL   Final   Organism ID, Bacteria PSEUDOMONAS PUTIDA   Final  URINE CULTURE      Status: None   Collection Time    02/02/14 11:50 AM      Result Value Ref Range Status   Specimen Description URINE, RANDOM   Final   Special Requests NONE   Final   Culture  Setup Time     Final   Value: 02/02/2014 13:10     Performed at [facility]     Final   Value: NO GROWTH     Performed at [facility]   Culture     Final   Value: NO GROWTH     Performed at [facility]   Report Status 02/03/2014 FINAL   Final  WOUND CULTURE     Status: None   Collection Time    02/02/14  1:54 PM      Result Value Ref Range Status   Specimen Description WOUND TRACHEAL SITE   Final   Special Requests Normal   Final   Gram Stain     Final   Value: RARE WBC PRESENT, PREDOMINANTLY MONONUCLEAR     NO SQUAMOUS EPITHELIAL CELLS SEEN     NO ORGANISMS SEEN     Performed at [facility]  Culture     Final   Value: MULTIPLE ORGANISMS PRESENT, NONE PREDOMINANT     Note: NO STAPHYLOCOCCUS AUREUS ISOLATED NO GROUP A STREP (S.PYOGENES) ISOLATED     Performed at [facility]   Report Status 02/04/2014 FINAL   Final  CULTURE, BLOOD (ROUTINE X 2)     Status: None   Collection Time    02/02/14  2:10 PM      Result Value Ref Range Status   Specimen Description BLOOD PICC LINE   Final   Special Requests BOTTLES DRAWN AEROBIC AND ANAEROBIC 10CC   Final   Culture  Setup Time     Final   Value: 02/02/2014 19:53     Performed at [facility]   Culture     Final   Value: NO GROWTH 5 DAYS     Performed at [facility]   Report Status 02/08/2014 FINAL   Final    Studies/Results: Dg Chest Port 1 View  02/10/2014   CLINICAL DATA:  Respiratory failure.  EXAM: PORTABLE CHEST - 1 VIEW  COMPARISON:  02/05/2014  FINDINGS: Numerous leads and wires project over the chest. Tracheostomy unchanged in position. A right-sided PICC line terminates at the mid SVC.  Hyperinflation. Cardiomegaly. Trace left pleural fluid or thickening. Improved right  upper lobe atelectasis. Patchy left base opacity and volume loss is similar.  IMPRESSION: Hyperinflation with improved right-sided aeration.  Similar left base opacity. When compared back to 01/15/2014, favored to represent an area of resolving infection. Chronic scar could look similar   Electronically Signed   By: Abigail Miyamoto M.D.   On: 02/10/2014 10:17    Medications: I have reviewed the patient's current medications.  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 Start Librium 25mg  PO QID and stop scheduled haldol and ativan. -contributor of hypophos/mag/kalemia, normalize electrolytes so unfortunately until stabilized daily CMP, mg, phos recommended. -monitor ongoing tolerance of statin that was likely started post AMI.  Hypomagnesium: -mag sulfate 2gm IV x 1 yesterday and cont daily mag  oxide 400mg  BID; goal 2.0 given CAD  Hypophosphotemia: -given difficulty to wean from vent with ongoing hypoxia, pt started on Phos-Nak 1pkt QID yesterday evening in hopes of maintaining normal level vs emergent KPhos infusions, continue telemetry for now.  Hypokalemia: -replete if needed, goal 4.0, recheck daily for now until proved stabilization and reduce the risk of hyperkalemia with scheduled potassium chloride order (KCl 16meq daily), titrate according to lasix use  Hyperglycemia: -Accuchecks q 6h, SSI low dose, hbgA1c pending -Levemir 5Units SQ daily being tolerated and can be titrated to goals  Alcoholic Hepatitis/Hepatis C hx: -resolving, acute hepatitis panel and GGT results pending. Discussed with family quantity of drinking.  Family stated that pt consumed large amounts of alcohol prior to going for procedure (recent hospitalization per admission H and p) which would explain resolving alcoholic hepatitis.  Also he drinks at least a 12-24 cans of beer weekly per his mother because she recently just bought 4-12 packs within the last 3 weeks; as pt is a self-proclaimed recluse x 2 years and distrustful of the medical community (as they forced him to go to doctor that prompted most recent hospitalization) which I explained unfortunately makes it difficult to refrain from ongoing alcohol and tobacco abuse.   Rhabdomyolysis: -noted and treated PTA, recheck CPK in am esp given statin use and non-specific pain  Anasarca/Hypoalbuminemia: -lymphedema consult today -d/c scheduled lasix (40mg  IV BID) given newly developed pre-renal azotemia; consider 60mg  or 80mg  IV qam if restart needed given refractory edema -trial of Albumin 25g x 3 doses (with first dose to be given concurrent with Lasix 60mg  IV x 1) given its oncotic properties in hopes of eliminated excessive accumulate volume  in this severely critically ill patient with severe hypoalbuminemia and peripheral edema.  CAD s/p  PTCA/HLP: -continue cardiac supportive medications (BB, ASA, ACE-I, Diuretic) and titrate to goal and tolerance; benefits vs risk of continuing Brilinta daily as ordered per cardiology at OSH.   HTN, uncontrolled: improved with med titration -now normotensive; titrate anti-hypertensive agents to goal and tolerance using caution with increase of beta-blocker given risk of ppt bradycardia, observe for clinical effect  Anemia of chronic disease, microcytic/PUD: -H/H stable w/o s/s of GI bleed, cont ferrous sulfate + vitamin C BID, recheck in 3 months per PCP -checked iron panel, B12  Chronic Back Pain with Acute Pain Crisis likely post CODE BLUE: -change Fentanyl patch from 13mcg to 42mcg q 72h x 3 days then off, then start oxycodone 5mg  IR PO qam. Informed family that pt can not take NSAIDS (he was taking Aleve and Ibuprofen PTA for chronic back pain) given AMI/CAD and PUD. Therefore, he will likely require daily opiate therapy post d/c for back pain, will defer to PCP/Pain mgmt for definitive outpt treatment plan.. Also would consider bone mets given cancer dx and would recommend PET scan as outpt if not recently done, defer to ONC  MDD/Mood Disorder: - Effexor  75mg  BID and would recommend outpt psych consult as pt would benefit from counseling and psych medication mgmt.  Also d/w family that he planned on resuming his reclusive lifestyle and distrust to the medical community (to the point he would pick and choose which of his healthcare providers he chose to f/u with) could result in increased morbidity and event a terminal mortality event given severe occlusive CAD resulting in PTCA placement and need to continue anti-platelet therapy as instruction. Therefore, he should not resume alcohol and tobacco abuse.  Tobacco Abuse Disorder, continuous: -ordered Nicotine Patch 21mg  daily   Hypothyroidism: -continue levothyroxine 76mcg daily; recommend recheck TSH in 4 weeks and adjust dose accordingly;  may warrant further workup as this is a new diagnosis for pt  Vitamin D Deficiency: -cont supplementation and recheck level in 4 weeks and adjust dose accordingly  Hyponatremia Decrease h2O to 50 every 3  Generalized Weakness: -PT/OT eval and tx when able  GI proph/Gastritis/PUD: Protonix 40mg  IV BID/probiotic VTE  proph:   Heparin 5000U SQ BID given high bleeding risk with anti-platelet medication  Family Communication:/9/28 by doctor Toler : 2 Sisters (Juliann Pulse and Forsan) that live in [place], [place] and Mother (living w/daughter now while pt is hospitalized as mother lives in [place] with pt (prefers to be called Butch):  I informed Juliann Pulse that they need to provide evidence of MPOA/Advanced Directed that Juliann Pulse told me her sister Jackelyn Poling has???  Deferred them to our case mgmt team for completion of advanced directive.   NOTE: Patient HAS NOT developed an advanced directive with an attorney.     Merton Border 02/10/2014, 2:57 PM

## 2014-02-11 ENCOUNTER — Other Ambulatory Visit (HOSPITAL_COMMUNITY): Payer: Self-pay

## 2014-02-11 LAB — COMPREHENSIVE METABOLIC PANEL
ALBUMIN: 1.4 g/dL — AB (ref 3.5–5.2)
ALT: 370 U/L — ABNORMAL HIGH (ref 0–53)
ANION GAP: 25 — AB (ref 5–15)
AST: 600 U/L — AB (ref 0–37)
Alkaline Phosphatase: 175 U/L — ABNORMAL HIGH (ref 39–117)
BUN: 51 mg/dL — AB (ref 6–23)
CO2: 20 mEq/L (ref 19–32)
CREATININE: 1.51 mg/dL — AB (ref 0.50–1.35)
Calcium: 8.5 mg/dL (ref 8.4–10.5)
Chloride: 101 mEq/L (ref 96–112)
GFR calc Af Amer: 57 mL/min — ABNORMAL LOW (ref >90)
GFR calc non Af Amer: 49 mL/min — ABNORMAL LOW (ref >90)
Glucose, Bld: 87 mg/dL (ref 70–99)
Potassium: >7.7 mEq/L (ref 3.7–5.3)
Sodium: 146 mEq/L (ref 137–147)
TOTAL PROTEIN: 5.2 g/dL — AB (ref 6.0–8.3)
Total Bilirubin: 0.6 mg/dL (ref 0.3–1.2)

## 2014-02-11 LAB — PHOSPHORUS: Phosphorus: 2.4 mg/dL (ref 2.3–4.6)

## 2014-02-11 LAB — BASIC METABOLIC PANEL
ANION GAP: 12 (ref 5–15)
BUN: 34 mg/dL — ABNORMAL HIGH (ref 6–23)
CO2: 23 meq/L (ref 19–32)
Calcium: 7.9 mg/dL — ABNORMAL LOW (ref 8.4–10.5)
Chloride: 96 mEq/L (ref 96–112)
Creatinine, Ser: 0.76 mg/dL (ref 0.50–1.35)
GFR calc Af Amer: >90 mL/min (ref >90)
GFR calc non Af Amer: >90 mL/min (ref >90)
GLUCOSE: 129 mg/dL — AB (ref 70–99)
Potassium: 5.7 mEq/L — ABNORMAL HIGH (ref 3.7–5.3)
SODIUM: 131 meq/L — AB (ref 137–147)

## 2014-02-11 LAB — TROPONIN I

## 2014-02-11 LAB — CBC
HEMATOCRIT: 23.7 % — AB (ref 39.0–52.0)
HEMOGLOBIN: 7.3 g/dL — AB (ref 13.0–17.0)
MCH: 27.5 pg (ref 26.0–34.0)
MCHC: 30.8 g/dL (ref 30.0–36.0)
MCV: 89.4 fL (ref 78.0–100.0)
Platelets: 300 10*3/uL (ref 150–400)
RBC: 2.65 MIL/uL — ABNORMAL LOW (ref 4.22–5.81)
RDW: 18.3 % — ABNORMAL HIGH (ref 11.5–15.5)
WBC: 21.8 10*3/uL — AB (ref 4.0–10.5)

## 2014-02-11 LAB — PROTIME-INR
INR: 1.43 (ref 0.00–1.49)
Prothrombin Time: 17.5 seconds — ABNORMAL HIGH (ref 11.6–15.2)

## 2014-02-11 LAB — LACTIC ACID, PLASMA: Lactic Acid, Venous: 19.3 mmol/L — ABNORMAL HIGH (ref 0.5–2.2)

## 2014-02-11 LAB — APTT: APTT: 45 s — AB (ref 24–37)

## 2014-02-11 MED FILL — Medication: Qty: 1 | Status: AC

## 2014-03-11 NOTE — Progress Notes (Signed)
Subjective: Pt unable to participate in ROS 2/2 acute encephalopathy and VDRF.  Objective: Vital signs in last 24 hours:   T             98.6 BP:  96/66 HR:  86 RR:  26 Pulse Ox: 98 % on Vent I/O:  +960  Physical Exam:  General: Ill appearing, confused HEENT: PERRLA EOMI bilaterally. Sclera anicteric bilaterally. MMM NECK: Supple, No JVD or LAD, + Trach midline and secure. #8 shiley Lungs: Symmetrical chest rise. Scatter wheezes and rhonchi CV: Normal S1, S2. No m/c/r Abdomen: Protuberant, however S/NT/ND +BS X 4. No rebound, guarding. +PEG secure and C/D/I EXT: No cyanosis or clubbing, only trace edema in extremities U>L and scrotum Skin: Warm/dry and intact Neuro: grossly intact and SMAEs, unable to perform formal CN exam 2/2 acute encephalopathy   Lab Results  Recent Labs  02/10/14 0508 02-27-2014 0521  WBC 8.9  --   HGB 8.6*  --   HCT 27.3*  --   NA 131* 131*  K 4.3 5.7*  CL 96 96  CO2 27 23  BUN 28* 34*  CREATININE 0.86 0.76   Liver Panel No results found for this basename: PROT, ALBUMIN, AST, ALT, ALKPHOS, BILITOT, BILIDIR, IBILI,  in the last 72 hours Sedimentation Rate No results found for this basename: ESRSEDRATE,  in the last 72 hours C-Reactive Protein No results found for this basename: CRP,  in the last 72 hours  Microbiology: Recent Results (from the past 240 hour(s))  CULTURE, RESPIRATORY (NON-EXPECTORATED)     Status: None   Collection Time    02/02/14 11:49 AM      Result Value Ref Range Status   Specimen Description TRACHEAL ASPIRATE   Final   Special Requests NONE   Final   Gram Stain     Final   Value: FEW WBC PRESENT,BOTH PMN AND MONONUCLEAR     RARE SQUAMOUS EPITHELIAL CELLS PRESENT     RARE GRAM POSITIVE COCCI     IN PAIRS IN CLUSTERS     Performed at [facility]   Culture     Final   Value: MODERATE PSEUDOMONAS PUTIDA     Performed at [facility]   Report Status 02/05/2014 FINAL   Final   Organism ID, Bacteria  PSEUDOMONAS PUTIDA   Final  URINE CULTURE     Status: None   Collection Time    02/02/14 11:50 AM      Result Value Ref Range Status   Specimen Description URINE, RANDOM   Final   Special Requests NONE   Final   Culture  Setup Time     Final   Value: 02/02/2014 13:10     Performed at [facility] [facility]     Final   Value: NO GROWTH     Performed at [facility]   Culture     Final   Value: NO GROWTH     Performed at [facility]   Report Status 02/03/2014 FINAL   Final  WOUND CULTURE     Status: None   Collection Time    02/02/14  1:54 PM      Result Value Ref Range Status   Specimen Description WOUND TRACHEAL SITE   Final   Special Requests Normal   Final   Gram Stain     Final   Value: RARE WBC PRESENT, PREDOMINANTLY MONONUCLEAR     NO SQUAMOUS EPITHELIAL CELLS SEEN  NO ORGANISMS SEEN     Performed at [facility]   Culture     Final   Value: MULTIPLE ORGANISMS PRESENT, NONE PREDOMINANT     Note: NO STAPHYLOCOCCUS AUREUS ISOLATED NO GROUP A STREP (S.PYOGENES) ISOLATED     Performed at [facility]   Report Status 02/04/2014 FINAL   Final  CULTURE, BLOOD (ROUTINE X 2)     Status: None   Collection Time    02/02/14  2:10 PM      Result Value Ref Range Status   Specimen Description BLOOD PICC LINE   Final   Special Requests BOTTLES DRAWN AEROBIC AND ANAEROBIC 10CC   Final   Culture  Setup Time     Final   Value: 02/02/2014 19:53     Performed at [facility]   Culture     Final   Value: NO GROWTH 5 DAYS     Performed at [facility]   Report Status 02/08/2014 FINAL   Final    Studies/Results: Dg Chest Port 1 View  02/10/2014   CLINICAL DATA:  Respiratory failure.  EXAM: PORTABLE CHEST - 1 VIEW  COMPARISON:  02/05/2014  FINDINGS: Numerous leads and wires project over the chest. Tracheostomy unchanged in position. A right-sided PICC line terminates at the mid SVC.  Hyperinflation. Cardiomegaly. Trace  left pleural fluid or thickening. Improved right upper lobe atelectasis. Patchy left base opacity and volume loss is similar.  IMPRESSION: Hyperinflation with improved right-sided aeration.  Similar left base opacity. When compared back to 01/19/2014, favored to represent an area of resolving infection. Chronic scar could look similar   Electronically Signed   By: Abigail Miyamoto M.D.   On: 02/10/2014 10:17    Medications: I have reviewed the patient's current medications.  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 Start Librium 25mg  PO QID and stop scheduled haldol and ativan. -contributor of hypophos/mag/kalemia, normalize electrolytes so unfortunately until stabilized daily CMP, mg, phos recommended. -monitor ongoing tolerance of statin that was likely started post AMI.  Hypomagnesium: -mag  sulfate 2gm IV x 1 yesterday and cont daily mag oxide 400mg  BID; goal 2.0 given CAD  Hypophosphotemia: -given difficulty to wean from vent with ongoing hypoxia, pt started on Phos-Nak 1pkt QID yesterday evening in hopes of maintaining normal level vs emergent KPhos infusions, continue telemetry for now.  Hyperkalemia: Discontinue KCl Kayexalate  check BMP in a.m.  Hyperglycemia: -Accuchecks q 6h, SSI low dose, hbgA1c  pending -Levemir 5Units SQ daily being tolerated and can be titrated to goals  Alcoholic Hepatitis/Hepatis C hx: -resolving, acute hepatitis panel and GGT results pending. Discussed with family quantity of drinking.  Family stated that pt consumed large amounts of alcohol prior to going for procedure (recent hospitalization per admission H and p) which would explain resolving alcoholic hepatitis.  Also he drinks at least a 12-24 cans of beer weekly per his mother because she recently just bought 4-12 packs within the last 3 weeks; as pt is a self-proclaimed recluse x 2 years and distrustful of the medical community (as they forced him to go to doctor that prompted most recent hospitalization) which I explained unfortunately makes it difficult to refrain from ongoing alcohol and tobacco abuse.   Rhabdomyolysis: -noted and treated PTA, recheck CPK in am esp given statin use and non-specific pain  Anasarca/Hypoalbuminemia: -lymphedema consult today -d/c scheduled lasix (40mg  IV BID) given newly developed pre-renal azotemia; consider 60mg  or 80mg  IV qam if restart needed given refractory edema -trial of Albumin 25g x 3 doses (with first dose to be given concurrent with Lasix 60mg  IV x 1) given its oncotic properties in hopes of eliminated excessive accumulate volume  in this severely critically ill patient with severe hypoalbuminemia and peripheral edema.  CAD s/p PTCA/HLP: -continue cardiac supportive medications (BB, ASA, ACE-I, Diuretic) and titrate to goal and tolerance;  benefits vs risk of continuing Brilinta daily as ordered per cardiology at OSH.   HTN, uncontrolled: improved with med titration -now normotensive; titrate anti-hypertensive agents to goal and tolerance using caution with increase of beta-blocker given risk of ppt bradycardia, observe for clinical effect  Anemia of chronic disease, microcytic/PUD: -H/H stable w/o s/s of GI bleed, cont ferrous sulfate + vitamin C BID, recheck in 3 months per PCP -checked iron panel, B12  Chronic Back Pain with Acute Pain Crisis likely post CODE BLUE: -change Fentanyl patch from 55mcg to 12mcg q 72h x 3 days then off, then start oxycodone 5mg  IR PO qam. Informed family that pt can not take NSAIDS (he was taking Aleve and Ibuprofen PTA for chronic back pain) given AMI/CAD and PUD. Therefore, he will likely require daily opiate therapy post d/c for back pain, will defer to PCP/Pain mgmt for definitive outpt treatment plan.. Also would consider bone mets given cancer dx and would recommend PET scan as outpt if not recently done, defer to ONC  MDD/Mood Disorder: - Effexor  75mg  BID and would recommend outpt psych consult as pt would benefit from counseling and psych medication mgmt.  Also d/w family that he planned on resuming his reclusive lifestyle and distrust to the medical community (to the point he would pick and choose which of his healthcare providers he chose to f/u with) could result in increased morbidity and event a terminal mortality event given severe occlusive CAD resulting in PTCA placement and need to continue anti-platelet therapy as instruction. Therefore, he should not resume alcohol and tobacco abuse.  Tobacco Abuse Disorder, continuous: -ordered Nicotine Patch 21mg  daily   Hypothyroidism: -continue levothyroxine 35mcg daily; recommend recheck TSH in 4 weeks and adjust dose accordingly; may warrant further workup as this is a new diagnosis for pt  Vitamin D Deficiency: -cont supplementation and  recheck level in 4 weeks and adjust dose accordingly  Hyponatremia Decrease h2O to 50 every 3  Generalized Weakness: -PT/OT eval and tx when able  GI proph/Gastritis/PUD: Protonix 40mg  IV BID/probiotic VTE proph:   Heparin 5000U SQ BID given  high bleeding risk with anti-platelet medication  Family Communication:/9/28 by doctor Toler : 2 Sisters (Juliann Pulse and [place]) that live in [place], [place] and Mother (living w/daughter now while pt is hospitalized as mother lives in [place] with pt (prefers to be called Butch):  I informed Juliann Pulse that they need to provide evidence of MPOA/Advanced Directed that Juliann Pulse told me her sister Jackelyn Poling has???  Deferred them to our case mgmt team for completion of advanced directive.   NOTE: Patient HAS NOT developed an advanced directive with an attorney.     Merton Border Mar 10, 2014, 2:05 PM

## 2014-03-11 NOTE — Progress Notes (Signed)
Chaplain responded to code blue.  No family present. I checked back twice during the evening.  Please call as needed if family arrives.  [place] [phone]

## 2014-03-11 NOTE — Code Documentation (Signed)
CODE BLUE NOTE  Patient Name: Bradley Doyle   MRN: [id number]   Date of Birth/ Sex: 06/26/1955 , male      Admission Date: 02/07/2014  Attending Provider: Merton Border, MD  Primary Diagnosis: <principal problem not specified>    Indication: Pt was in his usual state of health until this PM, when he was noted to be in PEA after an episode of emesis. Code blue was subsequently called. At the time of arrival on scene, ACLS protocol was underway.    Technical Description:  - CPR performance duration:  30 minutes; returned to SR then coded again for 10 minutes  - Was defibrillation or cardioversion used? Yes   - Was external pacer placed? No  - Was patient intubated pre/post CPR? Yes    Medications Administered: Y = Yes; Blank = No Amiodarone    Atropine  Y  Calcium    Epinephrine  Y  Lidocaine    Magnesium    Norepinephrine    Phenylephrine    Sodium bicarbonate  Y  Vasopressin      Post CPR evaluation:  - Final Status - Was patient successfully resuscitated ? Yes - What is current rhythm? Sinus rhythm - What is current hemodynamic status? Unable to obtain BP   Miscellaneous Information:  - Labs sent, including: CBC, CMP, Trop, INR, ABG, CXR, EKG, CBG  - Primary team notified?  Yes  - Family Notified? Yes  - Additional notes/ transfer status:  Patient was stabilized and left in the care of Dr. Laren Everts.     Physicians Responding to Code Blue: Joni Reining, DO, Internal Medicine PGY-2 Luiz Blare, DO, Family Medicine PGY-1

## 2014-03-11 DEATH — deceased

## 2016-04-20 IMAGING — CR DG ABD PORTABLE 1V
1 series · 1 of 1 positions shown · non-contrast
Comparison: Portable abdominal films [DATE] and 27, 9896.

CLINICAL DATA: Abdominal discomfort, ileus

EXAM:
PORTABLE ABDOMEN - 1 VIEW

[supine ap]
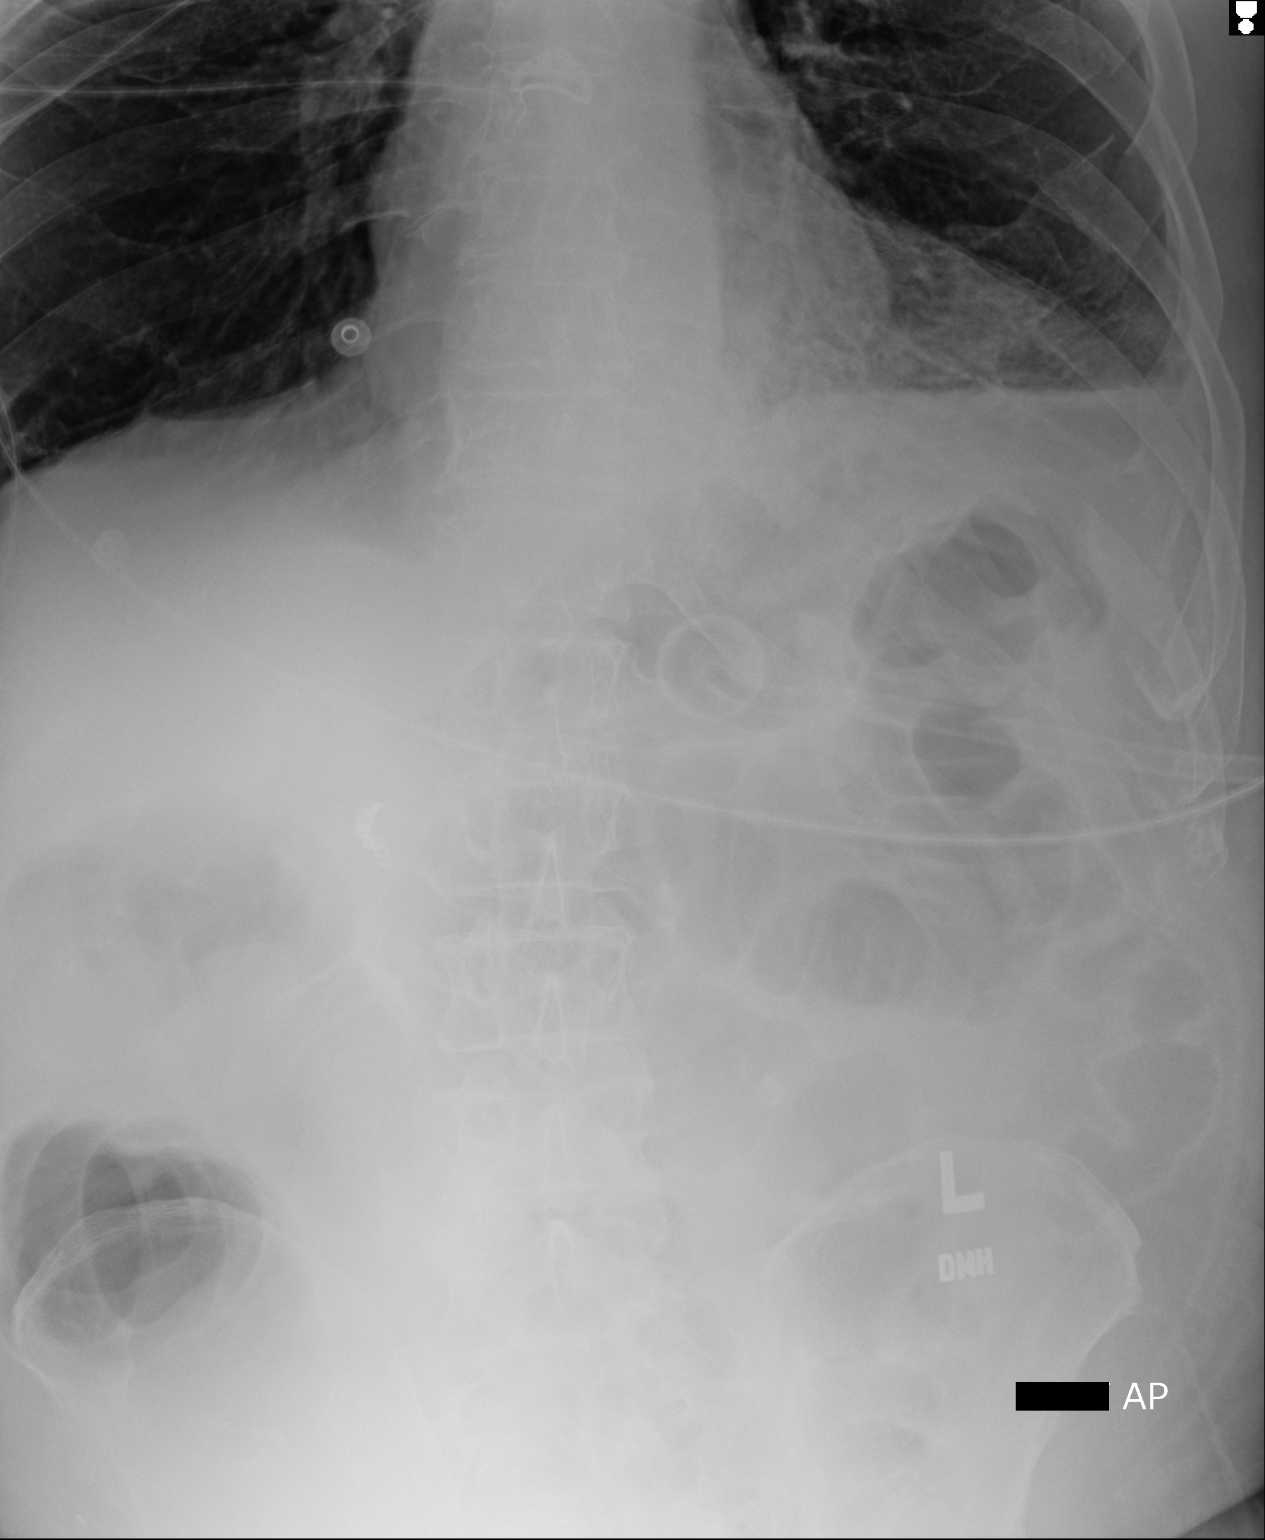

[1 of 1 positions shown; findings below may reference images not displayed]

FINDINGS: This single portable film excludes the mid pelvis and below and
portions of the right aspect of the abdomen. There remain loops of
mildly distended gas-filled small bowel. There is a small amount of
colonic gas. The gas -gastrostomy tube bulb is again demonstrated.
There are surgical clips in the gallbladder fossa. There is
atelectasis and likely a small amount of pleural fluid at the left
lung base.
IMPRESSION: Persistent gaseous distention of small-bowel loops consistent with
ileus or partial distal obstruction. No free extraluminal
collections are demonstrated.
